# Patient Record
Sex: Female | Born: 1955 | Race: White | Hispanic: No | Smoking: Former smoker
Health system: Southern US, Community
[De-identification: ages and names within clinical notes are randomized; demographics above are authoritative.]

## PROBLEM LIST (undated history)

## (undated) DIAGNOSIS — N200 Calculus of kidney: Secondary | ICD-10-CM

## (undated) DIAGNOSIS — F32A Depression, unspecified: Secondary | ICD-10-CM

## (undated) DIAGNOSIS — I739 Peripheral vascular disease, unspecified: Secondary | ICD-10-CM

## (undated) DIAGNOSIS — E785 Hyperlipidemia, unspecified: Secondary | ICD-10-CM

## (undated) DIAGNOSIS — F419 Anxiety disorder, unspecified: Secondary | ICD-10-CM

## (undated) DIAGNOSIS — N189 Chronic kidney disease, unspecified: Secondary | ICD-10-CM

## (undated) DIAGNOSIS — D649 Anemia, unspecified: Secondary | ICD-10-CM

## (undated) DIAGNOSIS — F329 Major depressive disorder, single episode, unspecified: Secondary | ICD-10-CM

## (undated) DIAGNOSIS — G4730 Sleep apnea, unspecified: Secondary | ICD-10-CM

## (undated) DIAGNOSIS — K219 Gastro-esophageal reflux disease without esophagitis: Secondary | ICD-10-CM

## (undated) DIAGNOSIS — N809 Endometriosis, unspecified: Secondary | ICD-10-CM

## (undated) HISTORY — PX: ABDOMINAL HYSTERECTOMY: SHX81

## (undated) HISTORY — PX: HERNIA REPAIR: SHX51

## (undated) HISTORY — PX: TONSILLECTOMY: SUR1361

## (undated) HISTORY — PX: EXCISION LESION FOOT: SHX6596

## (undated) HISTORY — PX: COLON SURGERY: SHX602

---

## 2005-07-22 ENCOUNTER — Ambulatory Visit: Payer: Self-pay | Admitting: Family Medicine

## 2005-09-30 ENCOUNTER — Ambulatory Visit: Payer: Self-pay | Admitting: Specialist

## 2010-02-06 ENCOUNTER — Ambulatory Visit: Payer: Self-pay | Admitting: Internal Medicine

## 2010-02-23 ENCOUNTER — Ambulatory Visit: Payer: Self-pay | Admitting: Family Medicine

## 2013-10-14 ENCOUNTER — Ambulatory Visit: Payer: Self-pay | Admitting: Internal Medicine

## 2013-12-12 DIAGNOSIS — F17210 Nicotine dependence, cigarettes, uncomplicated: Secondary | ICD-10-CM | POA: Insufficient documentation

## 2014-04-01 ENCOUNTER — Ambulatory Visit: Payer: Self-pay

## 2014-08-12 DIAGNOSIS — D3610 Benign neoplasm of peripheral nerves and autonomic nervous system, unspecified: Secondary | ICD-10-CM | POA: Insufficient documentation

## 2014-12-18 DIAGNOSIS — B001 Herpesviral vesicular dermatitis: Secondary | ICD-10-CM | POA: Insufficient documentation

## 2015-12-19 DIAGNOSIS — G4733 Obstructive sleep apnea (adult) (pediatric): Secondary | ICD-10-CM | POA: Insufficient documentation

## 2016-01-13 ENCOUNTER — Ambulatory Visit: Payer: Self-pay | Admitting: Internal Medicine

## 2016-02-04 ENCOUNTER — Encounter: Payer: Self-pay | Admitting: *Deleted

## 2016-02-05 ENCOUNTER — Encounter
Admission: RE | Disposition: A | Discharge status: Home / Self Care | Payer: Self-pay | Source: Ambulatory Visit | Attending: Gastroenterology

## 2016-02-05 ENCOUNTER — Ambulatory Visit: Payer: BLUE CROSS/BLUE SHIELD | Admitting: Anesthesiology

## 2016-02-05 ENCOUNTER — Encounter: Payer: Self-pay | Admitting: *Deleted

## 2016-02-05 ENCOUNTER — Ambulatory Visit
Admission: RE | Admit: 2016-02-05 | Discharge: 2016-02-05 | Disposition: A | Discharge status: Home / Self Care | Payer: BLUE CROSS/BLUE SHIELD | Source: Ambulatory Visit | Attending: Gastroenterology | Admitting: Gastroenterology

## 2016-02-05 DIAGNOSIS — K219 Gastro-esophageal reflux disease without esophagitis: Secondary | ICD-10-CM | POA: Diagnosis not present

## 2016-02-05 DIAGNOSIS — Z7982 Long term (current) use of aspirin: Secondary | ICD-10-CM | POA: Diagnosis not present

## 2016-02-05 DIAGNOSIS — N189 Chronic kidney disease, unspecified: Secondary | ICD-10-CM | POA: Insufficient documentation

## 2016-02-05 DIAGNOSIS — F17200 Nicotine dependence, unspecified, uncomplicated: Secondary | ICD-10-CM | POA: Diagnosis not present

## 2016-02-05 DIAGNOSIS — J449 Chronic obstructive pulmonary disease, unspecified: Secondary | ICD-10-CM | POA: Diagnosis not present

## 2016-02-05 DIAGNOSIS — E785 Hyperlipidemia, unspecified: Secondary | ICD-10-CM | POA: Diagnosis not present

## 2016-02-05 DIAGNOSIS — Z79899 Other long term (current) drug therapy: Secondary | ICD-10-CM | POA: Insufficient documentation

## 2016-02-05 DIAGNOSIS — I739 Peripheral vascular disease, unspecified: Secondary | ICD-10-CM | POA: Diagnosis not present

## 2016-02-05 DIAGNOSIS — D120 Benign neoplasm of cecum: Secondary | ICD-10-CM | POA: Diagnosis not present

## 2016-02-05 DIAGNOSIS — Z8371 Family history of colonic polyps: Secondary | ICD-10-CM | POA: Diagnosis not present

## 2016-02-05 DIAGNOSIS — F329 Major depressive disorder, single episode, unspecified: Secondary | ICD-10-CM | POA: Diagnosis not present

## 2016-02-05 DIAGNOSIS — Z1211 Encounter for screening for malignant neoplasm of colon: Secondary | ICD-10-CM | POA: Insufficient documentation

## 2016-02-05 HISTORY — DX: Peripheral vascular disease, unspecified: I73.9

## 2016-02-05 HISTORY — PX: COLONOSCOPY WITH PROPOFOL: SHX5780

## 2016-02-05 HISTORY — DX: Anemia, unspecified: D64.9

## 2016-02-05 HISTORY — DX: Anxiety disorder, unspecified: F41.9

## 2016-02-05 HISTORY — DX: Calculus of kidney: N20.0

## 2016-02-05 HISTORY — DX: Major depressive disorder, single episode, unspecified: F32.9

## 2016-02-05 HISTORY — DX: Depression, unspecified: F32.A

## 2016-02-05 HISTORY — DX: Chronic kidney disease, unspecified: N18.9

## 2016-02-05 HISTORY — DX: Hyperlipidemia, unspecified: E78.5

## 2016-02-05 HISTORY — DX: Endometriosis, unspecified: N80.9

## 2016-02-05 HISTORY — DX: Gastro-esophageal reflux disease without esophagitis: K21.9

## 2016-02-05 SURGERY — COLONOSCOPY WITH PROPOFOL
Anesthesia: General

## 2016-02-05 MED ORDER — SODIUM CHLORIDE 0.9 % IV SOLN
INTRAVENOUS | Status: DC
  Administered 2016-02-05: 10:00:00 via INTRAVENOUS

## 2016-02-05 MED ORDER — LIDOCAINE HCL (CARDIAC) 20 MG/ML IV SOLN
INTRAVENOUS | Status: DC | PRN
  Administered 2016-02-05: 60 mg via INTRAVENOUS

## 2016-02-05 MED ORDER — PROPOFOL 10 MG/ML IV BOLUS
INTRAVENOUS | Status: DC | PRN
  Administered 2016-02-05: 40 mg via INTRAVENOUS

## 2016-02-05 MED ORDER — SODIUM CHLORIDE 0.9 % IV SOLN: INTRAVENOUS | Status: DC

## 2016-02-05 MED ORDER — PROPOFOL 500 MG/50ML IV EMUL
INTRAVENOUS | Status: DC | PRN
  Administered 2016-02-05: 150 ug/kg/min via INTRAVENOUS

## 2016-02-05 NOTE — H&P (Signed)
Primary Care Physician:  Gayland Curry, MD Primary Gastroenterologist:  Dr. Candace Cruise  Pre-Procedure History & Physical: HPI:  Marie Melendez is a 60 y.o. female is here for an colonoscopy.  Past Medical History  Diagnosis Date  . Anemia   . Anxiety   . Depression   . GERD (gastroesophageal reflux disease)   . Peripheral vascular disease ([place])   . Chronic kidney disease   . Hyperlipidemia   . Endometriosis   . Nephrolithiasis     Past Surgical History  Procedure Laterality Date  . Tonsillectomy    . Colon surgery    . Abdominal hysterectomy    . Excision lesion foot    . Hernia repair      Prior to Admission medications   Medication Sig Start Date End Date Taking? Authorizing Provider  ascorbic acid (VITAMIN C) 1000 MG tablet Take 1,000 mg by mouth daily.   Yes Historical Provider, MD  aspirin (ASPIRIN EC) 81 MG EC tablet Take 81 mg by mouth daily. Swallow whole.   Yes Historical Provider, MD  Cholecalciferol 1000 units capsule Take 1,000 Units by mouth daily.   Yes Historical Provider, MD  cyanocobalamin 1000 MCG tablet Take 1,000 mcg by mouth daily.   Yes Historical Provider, MD  lovastatin (MEVACOR) 10 MG tablet Take 80 mg by mouth at bedtime.   Yes Historical Provider, MD  lovastatin (MEVACOR) 20 MG tablet Take 80 mg by mouth at bedtime.   Yes Historical Provider, MD  Lysine 500 MG TABS Take by mouth.   Yes Historical Provider, MD  nicotine (NICODERM CQ - DOSED IN MG/24 HOURS) 21 mg/24hr patch Place 21 mg onto the skin daily.   Yes Historical Provider, MD  omega-3 acid ethyl esters (LOVAZA) 1 g capsule Take 1 g by mouth daily.   Yes Historical Provider, MD  omeprazole (PRILOSEC) 20 MG capsule Take 20 mg by mouth daily as needed.   Yes Historical Provider, MD  PARoxetine (PAXIL) 20 MG tablet Take 20 mg by mouth daily.   Yes Historical Provider, MD    Allergies as of 01/27/2016  . (Not on File)    History reviewed. No pertinent family history.  Social History    Social History  . Marital Status: Divorced    Spouse Name: N/A  . Number of Children: N/A  . Years of Education: N/A   Occupational History  . Not on file.   Social History Main Topics  . Smoking status: Current Every Day Smoker -- 1.00 packs/day  . Smokeless tobacco: Never Used  . Alcohol Use: Yes  . Drug Use: Not on file  . Sexual Activity: Not on file   Other Topics Concern  . Not on file   Social History Narrative    Review of Systems: See HPI, otherwise negative ROS  Physical Exam: BP 135/72 mmHg  Pulse 74  Temp(Src) 97.4 F (36.3 C) (Tympanic)  Resp 18  Ht 5\' 5"  (1.651 m)  Wt 164 lb (74.39 kg)  BMI 27.29 kg/m2  SpO2 96% General:   Alert,  pleasant and cooperative in NAD Head:  Normocephalic and atraumatic. Neck:  Supple; no masses or thyromegaly. Lungs:  Clear throughout to auscultation.    Heart:  Regular rate and rhythm. Abdomen:  Soft, nontender and nondistended. Normal bowel sounds, without guarding, and without rebound.   Neurologic:  Alert and  oriented x4;  grossly normal neurologically.  Impression/Plan: Marie Melendez is here for an colonoscopy to be performed for screening,  family hx of colon polyps  Risks, benefits, limitations, and alternatives regarding  colonoscopy have been reviewed with the patient.  Questions have been answered.  All parties agreeable.   Pershing Skidmore, Lupita Dawn, MD  02/05/2016, 10:14 AM

## 2016-02-05 NOTE — Transfer of Care (Signed)
Immediate Anesthesia Transfer of Care Note  Patient: Marie Melendez  Procedure(s) Performed: Procedure(s): COLONOSCOPY WITH PROPOFOL (N/A)  Patient Location: Endoscopy Unit  Anesthesia Type:General  Level of Consciousness: awake, alert , oriented and patient cooperative  Airway & Oxygen Therapy: Patient Spontanous Breathing and Patient connected to nasal cannula oxygen  Post-op Assessment: Report given to RN, Post -op Vital signs reviewed and stable and Patient moving all extremities X 4  Post vital signs: Reviewed and stable  Last Vitals:  Filed Vitals:   02/05/16 0920  BP: 135/72  Pulse: 74  Temp: 36.3 C  Resp: 18    Last Pain: There were no vitals filed for this visit.       Complications: No apparent anesthesia complications

## 2016-02-05 NOTE — Anesthesia Preprocedure Evaluation (Signed)
Anesthesia Evaluation  Patient identified by MRN, date of birth, ID band Patient awake    Reviewed: Allergy & Precautions, H&P , NPO status , Patient's Chart, lab work & pertinent test results  History of Anesthesia Complications Negative for: history of anesthetic complications  Airway Mallampati: III  TM Distance: >3 FB Neck ROM: limited    Dental  (+) Poor Dentition   Pulmonary neg shortness of breath, COPD, Current Smoker,    Pulmonary exam normal breath sounds clear to auscultation       Cardiovascular Exercise Tolerance: Good (-) angina+ Peripheral Vascular Disease  (-) Past MI and (-) DOE negative cardio ROS Normal cardiovascular exam Rhythm:regular Rate:Normal     Neuro/Psych PSYCHIATRIC DISORDERS Anxiety Depression negative neurological ROS     GI/Hepatic Neg liver ROS, GERD  Controlled,  Endo/Other  negative endocrine ROS  Renal/GU Renal disease  negative genitourinary   Musculoskeletal   Abdominal   Peds  Hematology negative hematology ROS (+)   Anesthesia Other Findings Past Medical History:   Anemia                                                       Anxiety                                                      Depression                                                   GERD (gastroesophageal reflux disease)                       Peripheral vascular disease (H[facility])                            Chronic kidney disease                                       Hyperlipidemia                                               Endometriosis                                                Nephrolithiasis                                             Past Surgical History:   TONSILLECTOMY  COLON SURGERY                                                 ABDOMINAL HYSTERECTOMY                                        EXCISION LESION FOOT                                           HERNIA REPAIR                                                BMI    Body Mass Index   27.29 kg/m 2      Reproductive/Obstetrics negative OB ROS                             Anesthesia Physical Anesthesia Plan  ASA: III  Anesthesia Plan: General   Post-op Pain Management:    Induction:   Airway Management Planned:   Additional Equipment:   Intra-op Plan:   Post-operative Plan:   Informed Consent: I have reviewed the patients History and Physical, chart, labs and discussed the procedure including the risks, benefits and alternatives for the proposed anesthesia with the patient or authorized representative who has indicated his/her understanding and acceptance.   Dental Advisory Given  Plan Discussed with: Anesthesiologist, CRNA and Surgeon  Anesthesia Plan Comments:         Anesthesia Quick Evaluation

## 2016-02-05 NOTE — Op Note (Signed)
[facility] Gastroenterology Patient Name: Marie Melendez Procedure Date: 02/05/2016 10:29 AM MRN: [id number] Account #: [id number] Date of Birth: 04/03/1956 Admit Type: Outpatient Age: 60 Room: [facility] ENDO ROOM 4 Gender: Female Note Status: Finalized Procedure:            Colonoscopy Indications:          Colon cancer screening in patient at increased risk:                        Family history of colon polyps in multiple 1st-degree                        relatives Providers:            Lupita Dawn. Candace Cruise, MD Referring MD:         Gayland Curry, MD (Referring MD) Medicines:            Monitored Anesthesia Care Complications:        No immediate complications. Procedure:            Pre-Anesthesia Assessment:                       - Prior to the procedure, a History and Physical was                        performed, and patient medications, allergies and                        sensitivities were reviewed. The patient's tolerance of                        previous anesthesia was reviewed.                       - The risks and benefits of the procedure and the                        sedation options and risks were discussed with the                        patient. All questions were answered and informed                        consent was obtained.                       - After reviewing the risks and benefits, the patient                        was deemed in satisfactory condition to undergo the                        procedure.                       After obtaining informed consent, the colonoscope was                        passed under direct vision. Throughout the procedure,  the patient's blood pressure, pulse, and oxygen                        saturations were monitored continuously. The                        Colonoscope was introduced through the anus and                        advanced to the the cecum, identified by appendiceal              orifice and ileocecal valve. The colonoscopy was                        performed without difficulty. The patient tolerated the                        procedure well. The quality of the bowel preparation                        was fair. Findings:      A medium polyp was found in the cecum. The polyp was sessile. The polyp       was removed with a hot snare. Resection and retrieval were complete.      The exam was otherwise without abnormality. Impression:           - Preparation of the colon was fair.                       - One medium polyp in the cecum, removed with a hot                        snare. Resected and retrieved.                       - The examination was otherwise normal. Recommendation:       - Discharge patient to home.                       - Await pathology results.                       - Repeat colonoscopy in 3 - 5 years for surveillance                        based on pathology results.                       - The findings and recommendations were discussed with                        the patient. Procedure Code(s):    --- Professional ---                       [phone], Colonoscopy, flexible; with removal of tumor(s),                        polyp(s), or other lesion(s) by snare technique Diagnosis Code(s):    --- Professional ---  Z83.71, Family history of colonic polyps                       D12.0, Benign neoplasm of cecum CPT copyright 2016 [employer]. All rights reserved. The codes documented in this report are preliminary and upon coder review may  be revised to meet current compliance requirements. Hulen Luster, MD 02/05/2016 10:44:11 AM This report has been signed electronically. Number of Addenda: 0 Note Initiated On: 02/05/2016 10:29 AM Scope Withdrawal Time: 0 hours 5 minutes 33 seconds  Total Procedure Duration: 0 hours 7 minutes 55 seconds       [facility]

## 2016-02-05 NOTE — Anesthesia Postprocedure Evaluation (Signed)
Anesthesia Post Note  Patient: [name]  Procedure(s) Performed: Procedure(s) (LRB): COLONOSCOPY WITH PROPOFOL (N/A)  Patient location during evaluation: Endoscopy Anesthesia Type: General Level of consciousness: awake and alert Pain management: pain level controlled Vital Signs Assessment: post-procedure vital signs reviewed and stable Respiratory status: spontaneous breathing, nonlabored ventilation, respiratory function stable and patient connected to nasal cannula oxygen Cardiovascular status: blood pressure returned to baseline and stable Postop Assessment: no signs of nausea or vomiting Anesthetic complications: no    Last Vitals:  Filed Vitals:   02/05/16 1050 02/05/16 1110  BP: 111/71 128/72  Pulse:    Temp:    Resp:      Last Pain: There were no vitals filed for this visit.               Precious Haws Piscitello

## 2016-02-06 ENCOUNTER — Encounter: Payer: Self-pay | Admitting: Gastroenterology

## 2016-02-06 LAB — SURGICAL PATHOLOGY

## 2016-03-26 ENCOUNTER — Other Ambulatory Visit: Payer: Self-pay | Admitting: Family Medicine

## 2016-03-26 DIAGNOSIS — Z1231 Encounter for screening mammogram for malignant neoplasm of breast: Secondary | ICD-10-CM

## 2016-04-12 ENCOUNTER — Other Ambulatory Visit: Payer: Self-pay | Admitting: Family Medicine

## 2016-04-12 ENCOUNTER — Ambulatory Visit
Admission: RE | Admit: 2016-04-12 | Discharge: 2016-04-12 | Disposition: A | Discharge status: Home / Self Care | Payer: BLUE CROSS/BLUE SHIELD | Source: Ambulatory Visit | Attending: Family Medicine | Admitting: Family Medicine

## 2016-04-12 DIAGNOSIS — Z1231 Encounter for screening mammogram for malignant neoplasm of breast: Secondary | ICD-10-CM | POA: Insufficient documentation

## 2016-04-12 DIAGNOSIS — R928 Other abnormal and inconclusive findings on diagnostic imaging of breast: Secondary | ICD-10-CM | POA: Insufficient documentation

## 2016-04-15 ENCOUNTER — Other Ambulatory Visit: Payer: Self-pay | Admitting: Family Medicine

## 2016-04-15 DIAGNOSIS — R928 Other abnormal and inconclusive findings on diagnostic imaging of breast: Secondary | ICD-10-CM

## 2016-05-03 ENCOUNTER — Ambulatory Visit
Admission: RE | Admit: 2016-05-03 | Discharge: 2016-05-03 | Disposition: A | Discharge status: Home / Self Care | Payer: BLUE CROSS/BLUE SHIELD | Source: Ambulatory Visit | Attending: Family Medicine | Admitting: Family Medicine

## 2016-05-03 DIAGNOSIS — R928 Other abnormal and inconclusive findings on diagnostic imaging of breast: Secondary | ICD-10-CM | POA: Diagnosis not present

## 2016-05-03 DIAGNOSIS — N6489 Other specified disorders of breast: Secondary | ICD-10-CM | POA: Diagnosis present

## 2016-12-22 DIAGNOSIS — D72829 Elevated white blood cell count, unspecified: Secondary | ICD-10-CM | POA: Insufficient documentation

## 2017-02-04 ENCOUNTER — Ambulatory Visit (INDEPENDENT_AMBULATORY_CARE_PROVIDER_SITE_OTHER): Payer: BC Managed Care – PPO | Admitting: Vascular Surgery

## 2017-02-04 ENCOUNTER — Telehealth (INDEPENDENT_AMBULATORY_CARE_PROVIDER_SITE_OTHER): Payer: Self-pay | Admitting: Vascular Surgery

## 2017-02-04 ENCOUNTER — Encounter (INDEPENDENT_AMBULATORY_CARE_PROVIDER_SITE_OTHER): Payer: Self-pay | Admitting: Vascular Surgery

## 2017-02-04 VITALS — BP 149/83 | HR 90 | Resp 16 | Wt 173.0 lb

## 2017-02-04 DIAGNOSIS — I872 Venous insufficiency (chronic) (peripheral): Secondary | ICD-10-CM

## 2017-02-04 DIAGNOSIS — M7989 Other specified soft tissue disorders: Secondary | ICD-10-CM | POA: Insufficient documentation

## 2017-02-04 DIAGNOSIS — E785 Hyperlipidemia, unspecified: Secondary | ICD-10-CM | POA: Diagnosis not present

## 2017-02-04 NOTE — Telephone Encounter (Signed)
Per Dr. Lucky Cowboy we will bring the patient in to have unna boots placed. An appt was offered.

## 2017-02-04 NOTE — Assessment & Plan Note (Signed)
lipid control important in reducing the progression of atherosclerotic disease. Continue statin therapy  

## 2017-02-04 NOTE — Telephone Encounter (Signed)
Patient called in stating she needs unna wraps done today. She has seen KS in the past. I advised her it would need to come from a provider. But that I would message the nurses for advice.

## 2017-02-04 NOTE — Progress Notes (Signed)
MRN : [id number]  Marie Melendez is a 61 y.o. (11/04/1955) female who presents with chief complaint of  Chief Complaint  Patient presents with  . Follow-up  .  History of Present Illness: Patient returns today in follow up of Left leg swelling and discoloration. She has a previous history of a venous ulcer just above the left ankle. She has been having foot problems recently and wrapping her foot she has noticed increasing puffiness and prominence of the left ankle. She has some noticeable venous stasis areas there at a baseline, but these are more prominent recently. Her right leg really does not have much in the way of issues. Her foot hurts on both sides, but her right leg does not have the venous issue she does on the left. She just started wearing compression stockings today and she has noticed that has helped.  Current Outpatient Prescriptions  Medication Sig Dispense Refill  . ascorbic acid (VITAMIN C) 1000 MG tablet Take 1,000 mg by mouth daily.    Marland Kitchen aspirin (ASPIRIN EC) 81 MG EC tablet Take 81 mg by mouth daily. Swallow whole.    . Cholecalciferol 1000 units capsule Take 1,000 Units by mouth daily.    . cyanocobalamin 1000 MCG tablet Take 1,000 mcg by mouth daily.    Marland Kitchen lovastatin (MEVACOR) 20 MG tablet Take 80 mg by mouth at bedtime.    Marland Kitchen Lysine 500 MG TABS Take by mouth.    . nicotine (NICODERM CQ - DOSED IN MG/24 HOURS) 21 mg/24hr patch Place 21 mg onto the skin daily.    Marland Kitchen omega-3 acid ethyl esters (LOVAZA) 1 g capsule Take 1 g by mouth daily.    Marland Kitchen omeprazole (PRILOSEC) 20 MG capsule Take 20 mg by mouth daily as needed.    Marland Kitchen PARoxetine (PAXIL) 20 MG tablet Take 20 mg by mouth daily.    Marland Kitchen lovastatin (MEVACOR) 10 MG tablet Take 80 mg by mouth at bedtime.     No current facility-administered medications for this visit.     Past Medical History:  Diagnosis Date  . Anemia   . Anxiety   . Chronic kidney disease   . Depression   . Endometriosis   . GERD (gastroesophageal  reflux disease)   . Hyperlipidemia   . Nephrolithiasis   . Peripheral vascular disease [facility])     Past Surgical History:  Procedure Laterality Date  . ABDOMINAL HYSTERECTOMY    . COLON SURGERY    . COLONOSCOPY WITH PROPOFOL N/A 02/05/2016   Procedure: COLONOSCOPY WITH PROPOFOL;  Surgeon: Hulen Luster, MD;  Location: [facility] ENDOSCOPY;  Service: Gastroenterology;  Laterality: N/A;  . EXCISION LESION FOOT    . HERNIA REPAIR    . TONSILLECTOMY      Social History Social History  Substance Use Topics  . Smoking status: Current Every Day Smoker    Packs/day: 1.00  . Smokeless tobacco: Never Used  . Alcohol use Yes    Family History Family History  Problem Relation Age of Onset  . Breast cancer Mother 76  . Thyroid cancer Mother   . Lung cancer Father   . Breast cancer Paternal Aunt   . Bone cancer Maternal Grandmother   . Breast cancer Maternal Grandmother   . Lymphoma Maternal Grandmother   . Colon cancer Paternal Grandfather     Allergies  Allergen Reactions  . Oxycodone   . Sulfa Antibiotics      REVIEW OF SYSTEMS (Negative unless checked)  Constitutional: []   Weight loss  [] Fever  [] Chills Cardiac: [] Chest pain   [] Chest pressure   [] Palpitations   [] Shortness of breath when laying flat   [] Shortness of breath at rest   [] Shortness of breath with exertion. Vascular:  [] Pain in legs with walking   [] Pain in legs at rest   [] Pain in legs when laying flat   [] Claudication   [] Pain in feet when walking  [] Pain in feet at rest  [] Pain in feet when laying flat   [] History of DVT   [] Phlebitis   [x] Swelling in legs   [x] Varicose veins   [] Non-healing ulcers Pulmonary:   [] Uses home oxygen   [] Productive cough   [] Hemoptysis   [] Wheeze  [] COPD   [] Asthma Neurologic:  [] Dizziness  [] Blackouts   [] Seizures   [] History of stroke   [] History of TIA  [] Aphasia   [] Temporary blindness   [] Dysphagia   [] Weakness or numbness in arms   [] Weakness or numbness in legs Musculoskeletal:   [] Arthritis   [x] Joint swelling   [x] Joint pain   [] Low back pain Hematologic:  [] Easy bruising  [] Easy bleeding   [] Hypercoagulable state   [] Anemic   Gastrointestinal:  [] Blood in stool   [] Vomiting blood  [] Gastroesophageal reflux/heartburn   [] Abdominal pain Genitourinary:  [] Chronic kidney disease   [] Difficult urination  [] Frequent urination  [] Burning with urination   [] Hematuria Skin:  [] Rashes   [] Ulcers   [] Wounds Psychological:  [] History of anxiety   []  History of major depression.  Physical Examination  BP (!) 149/83   Pulse 90   Resp 16   Wt 173 lb (78.5 kg)   BMI 28.79 kg/m  Gen:  WD/WN, NAD Head: [place]/AT, No temporalis wasting. Ear/Nose/Throat: Hearing grossly intact, nares w/o erythema or drainage, trachea midline Eyes: Conjunctiva clear. Sclera non-icteric Neck: Supple.  No JVD.  Pulmonary:  Good air movement, no use of accessory muscles.  Cardiac: RRR, normal S1, S2 Vascular:  Vessel Right Left  Radial Palpable Palpable  Ulnar Palpable Palpable  Brachial Palpable Palpable  Carotid Palpable, without bruit Palpable, without bruit  Aorta Not palpable N/A  Femoral Palpable Palpable  Popliteal Palpable Palpable  PT Palpable Palpable  DP Palpable Palpable   Gastrointestinal: soft, non-tender/non-distended. No guarding/reflex.  Musculoskeletal: M/S 5/5 throughout.  No deformity or atrophy. 1+ LLE edema with prominent varicosities and stasis changes down around the foot and ankle.Marland Kitchen Neurologic: Sensation grossly intact in extremities.  Symmetrical.  Speech is fluent.  Psychiatric: Judgment intact, Mood & affect appropriate for pt's clinical situation. Dermatologic: A small discolored area about 3-4 cm above the left medial ankle is present from her previous healed ulceration.      Labs No results found for this or any previous visit (from the past 2160 hour(s)).  Radiology No results found.    Assessment/Plan  Hyperlipidemia lipid control important in  reducing the progression of atherosclerotic disease. Continue statin therapy   Swelling of limb Recommended the regular use of compression stockings, elevation, and increasing activity.  Venous (peripheral) insufficiency The patient has some prominent varicosities with venous stasis changes on the left leg. At this point, I would recommend compression stockings and elevation. I do not believe the swelling is bad enough and there is no skin breakdown to require Unna boot at this time. The patient will follow up on an as-needed basis.    Leotis Pain, MD  02/04/2017 4:13 PM    This note was created with Dragon medical transcription system.  Any errors from dictation are  purely unintentional

## 2017-02-04 NOTE — Assessment & Plan Note (Signed)
The patient has some prominent varicosities with venous stasis changes on the left leg. At this point, I would recommend compression stockings and elevation. I do not believe the swelling is bad enough and there is no skin breakdown to require Unna boot at this time. The patient will follow up on an as-needed basis.

## 2017-02-04 NOTE — Telephone Encounter (Signed)
LMVM TO INFORM PATIENT SHE COULD COME IN TODAY TO BE SEEN BY JD HE SAYS SHE CAN COME AT 1:00 P.M

## 2017-02-04 NOTE — Assessment & Plan Note (Signed)
Recommended the regular use of compression stockings, elevation, and increasing activity.

## 2017-03-10 DIAGNOSIS — R918 Other nonspecific abnormal finding of lung field: Secondary | ICD-10-CM | POA: Insufficient documentation

## 2018-01-09 ENCOUNTER — Ambulatory Visit
Admission: EM | Admit: 2018-01-09 | Discharge: 2018-01-09 | Disposition: A | Discharge status: Home / Self Care | Payer: BC Managed Care – PPO | Attending: Family Medicine | Admitting: Family Medicine

## 2018-01-09 ENCOUNTER — Encounter: Payer: Self-pay | Admitting: *Deleted

## 2018-01-09 DIAGNOSIS — J0100 Acute maxillary sinusitis, unspecified: Secondary | ICD-10-CM | POA: Diagnosis not present

## 2018-01-09 DIAGNOSIS — J441 Chronic obstructive pulmonary disease with (acute) exacerbation: Secondary | ICD-10-CM

## 2018-01-09 MED ORDER — ALBUTEROL SULFATE HFA 108 (90 BASE) MCG/ACT IN AERS: 1.0000 | INHALATION_SPRAY | Freq: Four times a day (QID) | RESPIRATORY_TRACT | 0 refills | Status: DC | PRN

## 2018-01-09 MED ORDER — DOXYCYCLINE HYCLATE 100 MG PO TABS: 100.0000 mg | ORAL_TABLET | Freq: Two times a day (BID) | ORAL | 0 refills | Status: DC

## 2018-01-09 MED ORDER — PREDNISONE 20 MG PO TABS: 20.0000 mg | ORAL_TABLET | Freq: Every day | ORAL | 0 refills | Status: DC

## 2018-01-09 NOTE — ED Triage Notes (Signed)
C/o cough, pressure and facial pain, body aches and ear fullness. Pt thinks she has sinus infection. No temperature reported.

## 2018-01-09 NOTE — ED Provider Notes (Signed)
[facility]    CSN: [id number] Arrival date & time: 01/09/18  1237     History   Chief Complaint Chief Complaint  Patient presents with  . Facial Pain    HPI Marie Melendez is a 62 y.o. female.   The history is provided by the patient.  URI  Presenting symptoms: congestion, cough, facial pain, fever and rhinorrhea   Severity:  Moderate Onset quality:  Sudden Duration:  1 week Timing:  Constant Progression:  Worsening Chronicity:  New Relieved by:  Nothing Ineffective treatments:  OTC medications Associated symptoms: headaches, sinus pain and wheezing   Risk factors: chronic kidney disease and chronic respiratory disease (chronic smoker)   Risk factors: not elderly, no chronic cardiac disease, no diabetes mellitus, no immunosuppression, no recent illness, no recent travel and no sick contacts     Past Medical History:  Diagnosis Date  . Anemia   . Anxiety   . Chronic kidney disease   . Depression   . Endometriosis   . GERD (gastroesophageal reflux disease)   . Hyperlipidemia   . Nephrolithiasis   . Peripheral vascular disease [facility])     Patient Active Problem List   Diagnosis Date Noted  . Hyperlipidemia 02/04/2017  . Swelling of limb 02/04/2017  . Venous (peripheral) insufficiency 02/04/2017    Past Surgical History:  Procedure Laterality Date  . ABDOMINAL HYSTERECTOMY    . COLON SURGERY    . COLONOSCOPY WITH PROPOFOL N/A 02/05/2016   Procedure: COLONOSCOPY WITH PROPOFOL;  Surgeon: Hulen Luster, MD;  Location: [facility] ENDOSCOPY;  Service: Gastroenterology;  Laterality: N/A;  . EXCISION LESION FOOT    . HERNIA REPAIR    . TONSILLECTOMY      OB History   None      Home Medications    Prior to Admission medications   Medication Sig Start Date End Date Taking? Authorizing Provider  ascorbic acid (VITAMIN C) 1000 MG tablet Take 1,000 mg by mouth daily.   Yes [provider]  aspirin (ASPIRIN EC) 81 MG EC tablet Take 81 mg by mouth  daily. Swallow whole.   Yes [provider]  Cholecalciferol 1000 units capsule Take 1,000 Units by mouth daily.   Yes [provider]  cyanocobalamin 1000 MCG tablet Take 1,000 mcg by mouth daily.   Yes [provider]  omeprazole (PRILOSEC) 20 MG capsule Take 20 mg by mouth daily as needed.   Yes [provider]  PARoxetine (PAXIL) 20 MG tablet Take 20 mg by mouth daily.   Yes [provider]  albuterol (PROVENTIL HFA;VENTOLIN HFA) 108 (90 Base) MCG/ACT inhaler Inhale 1-2 puffs into the lungs every 6 (six) hours as needed for wheezing or shortness of breath. 01/09/18   Norval Gable, MD  doxycycline (VIBRA-TABS) 100 MG tablet Take 1 tablet (100 mg total) by mouth 2 (two) times daily. 01/09/18   Norval Gable, MD  lovastatin (MEVACOR) 10 MG tablet Take 80 mg by mouth at bedtime.    [provider]  lovastatin (MEVACOR) 20 MG tablet Take 80 mg by mouth at bedtime.    [provider]  Lysine 500 MG TABS Take by mouth.    [provider]  nicotine (NICODERM CQ - DOSED IN MG/24 HOURS) 21 mg/24hr patch Place 21 mg onto the skin daily.    [provider]  omega-3 acid ethyl esters (LOVAZA) 1 g capsule Take 1 g by mouth daily.    [provider]  predniSONE (  DELTASONE) 20 MG tablet Take 1 tablet (20 mg total) by mouth daily. 01/09/18   Norval Gable, MD    Family History Family History  Problem Relation Age of Onset  . Breast cancer Mother 26  . Thyroid cancer Mother   . Lung cancer Father   . Breast cancer Paternal Aunt   . Bone cancer Maternal Grandmother   . Breast cancer Maternal Grandmother   . Lymphoma Maternal Grandmother   . Colon cancer Paternal Grandfather     Social History Social History   Tobacco Use  . Smoking status: Current Every Day Smoker    Packs/day: 1.00  . Smokeless tobacco: Never Used  Substance Use Topics  . Alcohol use: Yes  . Drug use: Yes    Types: Marijuana      Allergies   Oxycodone and Sulfa antibiotics   Review of Systems Review of Systems  Constitutional: Positive for fever.  HENT: Positive for congestion, rhinorrhea and sinus pain.   Respiratory: Positive for cough and wheezing.   Neurological: Positive for headaches.     Physical Exam Triage Vital Signs ED Triage Vitals  Enc Vitals Group     BP 01/09/18 1306 (!) 142/72     Pulse Rate 01/09/18 1306 94     Resp 01/09/18 1306 16     Temp 01/09/18 1306 98.4 F (36.9 C)     Temp Source 01/09/18 1306 Oral     SpO2 01/09/18 1306 96 %     Weight 01/09/18 1302 165 lb (74.8 kg)     Height 01/09/18 1302 5\' 6"  (1.676 m)     Head Circumference --      Peak Flow --      Pain Score 01/09/18 1301 7     Pain Loc --      Pain Edu? --      Excl. in [place]? --    No data found.  Updated Vital Signs BP (!) 142/72 (BP Location: Left Arm)   Pulse 94   Temp 98.4 F (36.9 C) (Oral)   Resp 16   Ht 5\' 6"  (1.676 m)   Wt 165 lb (74.8 kg)   SpO2 96%   BMI 26.63 kg/m   Visual Acuity Right Eye Distance:   Left Eye Distance:   Bilateral Distance:    Right Eye Near:   Left Eye Near:    Bilateral Near:     Physical Exam  Constitutional: She appears well-developed and well-nourished. No distress.  HENT:  Head: Normocephalic and atraumatic.  Right Ear: Tympanic membrane, external ear and ear canal normal.  Left Ear: Tympanic membrane, external ear and ear canal normal.  Nose: Mucosal edema and rhinorrhea present. No nose lacerations, sinus tenderness, nasal deformity, septal deviation or nasal septal hematoma. No epistaxis.  No foreign bodies. Right sinus exhibits maxillary sinus tenderness and frontal sinus tenderness. Left sinus exhibits maxillary sinus tenderness and frontal sinus tenderness.  Mouth/Throat: Uvula is midline, oropharynx is clear and moist and mucous membranes are normal. No oropharyngeal exudate.  Eyes: Pupils are equal, round, and reactive to light. Conjunctivae and  EOM are normal. Right eye exhibits no discharge. Left eye exhibits no discharge. No scleral icterus.  Neck: Normal range of motion. Neck supple. No thyromegaly present.  Cardiovascular: Normal rate, regular rhythm and normal heart sounds.  Pulmonary/Chest: Effort normal. No respiratory distress. She has wheezes (plus rhonchi both diffusely). She has no rales.  Lymphadenopathy:    She has no cervical adenopathy.  Skin: She  is not diaphoretic.  Nursing note and vitals reviewed.    UC Treatments / Results  Labs (all labs ordered are listed, but only abnormal results are displayed) Labs Reviewed - No data to display  EKG None  Radiology No results found.  Procedures Procedures (including critical care time)  Medications Ordered in UC Medications - No data to display  Initial Impression / Assessment and Plan / UC Course  I have reviewed the triage vital signs and the nursing notes.  Pertinent labs & imaging results that were available during my care of the patient were reviewed by me and considered in my medical decision making (see chart for details).    Final Clinical Impressions(s) / UC Diagnoses   Final diagnoses:  Acute maxillary sinusitis, recurrence not specified  Obstructive chronic bronchitis with exacerbation [facility])   Discharge Instructions   None    ED Prescriptions    Medication Sig Dispense Auth. Provider   doxycycline (VIBRA-TABS) 100 MG tablet Take 1 tablet (100 mg total) by mouth 2 (two) times daily. 20 tablet Norval Gable, MD   predniSONE (DELTASONE) 20 MG tablet Take 1 tablet (20 mg total) by mouth daily. 7 tablet Norval Gable, MD   albuterol (PROVENTIL HFA;VENTOLIN HFA) 108 (90 Base) MCG/ACT inhaler Inhale 1-2 puffs into the lungs every 6 (six) hours as needed for wheezing or shortness of breath. 1 Inhaler Zoee Heeney, Linward Foster, MD     1. diagnosis reviewed with patient 2. rx as per orders above; reviewed possible side effects, interactions, risks and  benefits  3. Recommend supportive treatment with otc analgesics prn, fluids 4. Follow-up prn if symptoms worsen or don't improve   Controlled Substance Prescriptions [place] Controlled Substance Registry consulted? Not Applicable   Norval Gable, MD 01/09/18 [phone]

## 2018-01-27 ENCOUNTER — Encounter: Payer: BC Managed Care – PPO | Admitting: Oncology

## 2018-02-17 ENCOUNTER — Encounter: Payer: Self-pay | Admitting: Family Medicine

## 2018-02-20 ENCOUNTER — Encounter: Payer: Self-pay | Admitting: Oncology

## 2018-02-20 ENCOUNTER — Inpatient Hospital Stay: Payer: BC Managed Care – PPO | Attending: Oncology | Admitting: Oncology

## 2018-02-20 ENCOUNTER — Telehealth (INDEPENDENT_AMBULATORY_CARE_PROVIDER_SITE_OTHER): Payer: Self-pay | Admitting: Vascular Surgery

## 2018-02-20 VITALS — BP 152/94 | HR 79 | Temp 97.9°F | Resp 18 | Ht 66.0 in | Wt 170.5 lb

## 2018-02-20 DIAGNOSIS — I83892 Varicose veins of left lower extremities with other complications: Secondary | ICD-10-CM | POA: Diagnosis not present

## 2018-02-20 DIAGNOSIS — I8392 Asymptomatic varicose veins of left lower extremity: Secondary | ICD-10-CM | POA: Insufficient documentation

## 2018-02-20 DIAGNOSIS — M7989 Other specified soft tissue disorders: Secondary | ICD-10-CM | POA: Insufficient documentation

## 2018-02-20 NOTE — Progress Notes (Signed)
Hematology/Oncology Consult note [facility] Telephone:([phone] [phone]  Patient Care Team: Gayland Curry, MD as PCP - General (Family Medicine)   Name of the patient: Marie Melendez  [id number]  08-18-1956    Reason for referral- family h/o DVT   Referring physician- Dr. Astrid Divine  Date of visit: 02/20/18   History of presenting illness-patient is a 62 year old female with a history of varicose veins.  She reports that she had a superficial venous thrombosis 20 years ago.  States that she never had any deep venous thrombosis or pulmonary embolism.  She was apparently treated with Coumadin at that time.  She does not remember how long she she has not been blood thinner for the last 15 years.  She has not had any recurrent episodes of DVT or PE since then.  She reports that her mother had pulmonary embolism due to unclear reasons and is possibly still on anticoagulation. H/o DVT in her grandfather and maternal aunt. Patient has had 1 prior pregnancy that was uneventful. No miscarriages. She does have varicosities in her b/l legs especially the left one. She has seen vein center in the past. Currently reports that balls of her feet hurt. She did have mildly worsening LLE edema a few days ago which is getting better. She is wearing b/l compression stockings  Doppler lower extremity back in 2008 at [facility] showed: "Summary of Findings Right No evidence of ilio-CFV obstruction. LeftThere is no evidence of deep venous obstruction. There is no evidence  of acute superficial venous obstruction. There is evidence of previous  obstruction in the GSV in the distal lower leg, now recanalized.There is  evidence of chronic obstruction in the proximal portion of the small saphenous  vein (Upper calf)."  ECOG PS- 1  Pain scale- 9   Review of systems- Review of Systems  Constitutional: Negative for chills, fever,  malaise/fatigue and weight loss.  HENT: Negative for congestion, ear discharge and nosebleeds.   Eyes: Negative for blurred vision.  Respiratory: Negative for cough, hemoptysis, sputum production, shortness of breath and wheezing.   Cardiovascular: Negative for chest pain, palpitations, orthopnea and claudication.  Gastrointestinal: Negative for abdominal pain, blood in stool, constipation, diarrhea, heartburn, melena, nausea and vomiting.  Genitourinary: Negative for dysuria, flank pain, frequency, hematuria and urgency.  Musculoskeletal: Negative for back pain, joint pain and myalgias.       Foot pain  Skin: Negative for rash.  Neurological: Negative for dizziness, tingling, focal weakness, seizures, weakness and headaches.  Endo/Heme/Allergies: Does not bruise/bleed easily.  Psychiatric/Behavioral: Negative for depression and suicidal ideas. The patient does not have insomnia.     Allergies  Allergen Reactions  . Oxycodone   . Sulfa Antibiotics     Patient Active Problem List   Diagnosis Date Noted  . Hyperlipidemia 02/04/2017  . Swelling of limb 02/04/2017  . Venous (peripheral) insufficiency 02/04/2017     Past Medical History:  Diagnosis Date  . Anemia   . Anxiety   . Chronic kidney disease   . Depression   . Endometriosis   . GERD (gastroesophageal reflux disease)   . Hyperlipidemia   . Nephrolithiasis   . Peripheral vascular disease [facility])      Past Surgical History:  Procedure Laterality Date  . ABDOMINAL HYSTERECTOMY    . COLON SURGERY    . COLONOSCOPY WITH PROPOFOL N/A 02/05/2016   Procedure: COLONOSCOPY WITH PROPOFOL;  Surgeon: Hulen Luster, MD;  Location: [facility] ENDOSCOPY;  Service: Gastroenterology;  Laterality:  N/A;  . EXCISION LESION FOOT    . HERNIA REPAIR    . TONSILLECTOMY      Social History   Socioeconomic History  . Marital status: Divorced    Spouse name: Not on file  . Number of children: Not on file  . Years of education: Not on file  .  Highest education level: Not on file  Occupational History  . Not on file  Social Needs  . Financial resource strain: Not on file  . Food insecurity:    Worry: Not on file    Inability: Not on file  . Transportation needs:    Medical: Not on file    Non-medical: Not on file  Tobacco Use  . Smoking status: Current Every Day Smoker    Packs/day: 1.00  . Smokeless tobacco: Never Used  Substance and Sexual Activity  . Alcohol use: Yes  . Drug use: Yes    Types: Marijuana  . Sexual activity: Not on file  Lifestyle  . Physical activity:    Days per week: Not on file    Minutes per session: Not on file  . Stress: Not on file  Relationships  . Social connections:    Talks on phone: Not on file    Gets together: Not on file    Attends religious service: Not on file    Active member of club or organization: Not on file    Attends meetings of clubs or organizations: Not on file    Relationship status: Not on file  . Intimate partner violence:    Fear of current or ex partner: Not on file    Emotionally abused: Not on file    Physically abused: Not on file    Forced sexual activity: Not on file  Other Topics Concern  . Not on file  Social History Narrative  . Not on file     Family History  Problem Relation Age of Onset  . Breast cancer Mother 77  . Thyroid cancer Mother   . Lung cancer Father   . Breast cancer Paternal Aunt   . Bone cancer Maternal Grandmother   . Breast cancer Maternal Grandmother   . Lymphoma Maternal Grandmother   . Colon cancer Paternal Grandfather      Current Outpatient Medications:  .  ascorbic acid (VITAMIN C) 1000 MG tablet, Take 1,000 mg by mouth daily., Disp: , Rfl:  .  aspirin (ASPIRIN EC) 81 MG EC tablet, Take 81 mg by mouth daily. Swallow whole., Disp: , Rfl:  .  lovastatin (MEVACOR) 20 MG tablet, Take 40 mg by mouth 2 (two) times daily. , Disp: , Rfl:  .  Lysine 500 MG TABS, Take by mouth., Disp: , Rfl:  .  omega-3 acid ethyl esters  (LOVAZA) 1 g capsule, Take 1 g by mouth daily., Disp: , Rfl:  .  omeprazole (PRILOSEC) 20 MG capsule, Take 20 mg by mouth daily as needed., Disp: , Rfl:  .  PARoxetine (PAXIL) 20 MG tablet, Take 20 mg by mouth daily., Disp: , Rfl:  .  albuterol (PROVENTIL HFA;VENTOLIN HFA) 108 (90 Base) MCG/ACT inhaler, Inhale 1-2 puffs into the lungs every 6 (six) hours as needed for wheezing or shortness of breath. (Patient not taking: Reported on 02/20/2018), Disp: 1 Inhaler, Rfl: 0 .  Cholecalciferol 1000 units capsule, Take 1,000 Units by mouth daily., Disp: , Rfl:  .  cyanocobalamin 1000 MCG tablet, Take 1,000 mcg by mouth daily., Disp: , Rfl:  .  doxycycline (VIBRA-TABS) 100 MG tablet, Take 1 tablet (100 mg total) by mouth 2 (two) times daily. (Patient not taking: Reported on 02/20/2018), Disp: 20 tablet, Rfl: 0 .  lovastatin (MEVACOR) 10 MG tablet, Take 80 mg by mouth at bedtime., Disp: , Rfl:  .  nicotine (NICODERM CQ - DOSED IN MG/24 HOURS) 21 mg/24hr patch, Place 21 mg onto the skin daily., Disp: , Rfl:  .  predniSONE (DELTASONE) 20 MG tablet, Take 1 tablet (20 mg total) by mouth daily. (Patient not taking: Reported on 02/20/2018), Disp: 7 tablet, Rfl: 0   Physical exam:  Vitals:   02/20/18 1000  BP: (!) 152/94  Pulse: 79  Resp: 18  Temp: 97.9 F (36.6 C)  TempSrc: Tympanic  SpO2: 98%  Weight: 170 lb 8.4 oz (77.3 kg)  Height: 5\' 6"  (1.676 m)   Physical Exam  Constitutional: She is oriented to person, place, and time. She appears well-developed and well-nourished.  HENT:  Head: Normocephalic and atraumatic.  Eyes: Pupils are equal, round, and reactive to light. EOM are normal.  Neck: Normal range of motion.  Cardiovascular: Normal rate, regular rhythm and normal heart sounds.  Pulmonary/Chest: Effort normal and breath sounds normal.  Abdominal: Soft. Bowel sounds are normal.  Musculoskeletal:  Varicosities noted over b/l LE. Left >right. Left  leg appears mildly more swollen than right    Neurological: She is alert and oriented to person, place, and time.  Skin: Skin is warm and dry.     Assessment and plan- Patient is a 62 y.o. female referred due to prior h/o thrombosis and extensive family h/o thrombosis  On looking back at doppler report from 2008- no DVT was seen at that time. There was a mention of previous obstruction in great saphenous vein which is a superficial vein. As such I do not see any conclusive evidence of prior DVT. Even if she did have DVT 15-20 years ago, she has not had one since then despite being off anticoagulation. Therefore there is no need for hypercoagulable work up at this time despite family history. No indication for prophylactic anticoagulation at this time.  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 I will obtain doppler LLE to r/o DVT but my suspicion is low at this point  We will call her with the results of her doppler. No hematology follow up needed at this time  Thank you for this kind referral and the opportunity to participate in the care of this patient   Visit Diagnosis 1. Swelling of left lower extremity   2. 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 Randa Evens, MD, MPH [facility] [place] [phone] 02/20/2018  12:32 PM

## 2018-02-20 NOTE — Telephone Encounter (Signed)
Patients wants to know when can she get her legs wrapped. Having swelling and said feels likes she bustin out a little, and her pain in feet. Can be reached at number in the system.

## 2018-02-21 ENCOUNTER — Telehealth (INDEPENDENT_AMBULATORY_CARE_PROVIDER_SITE_OTHER): Payer: Self-pay

## 2018-02-21 ENCOUNTER — Ambulatory Visit
Admission: RE | Admit: 2018-02-21 | Discharge: 2018-02-21 | Disposition: A | Discharge status: Home / Self Care | Payer: BC Managed Care – PPO | Source: Ambulatory Visit | Attending: Oncology | Admitting: Oncology

## 2018-02-21 ENCOUNTER — Telehealth: Payer: Self-pay | Admitting: *Deleted

## 2018-02-21 DIAGNOSIS — I83892 Varicose veins of left lower extremities with other complications: Secondary | ICD-10-CM

## 2018-02-21 DIAGNOSIS — M7989 Other specified soft tissue disorders: Secondary | ICD-10-CM

## 2018-02-21 NOTE — Telephone Encounter (Signed)
Patient called again and left another message about having her legs wrapped. Per Maudie Mercury she can have her legs wrapped for one month, and then she would need to see Maudie Mercury for a one month follow up.  Should the patient call again, she needs to set an appointment and a follow up with Maudie Mercury.

## 2018-02-21 NOTE — Telephone Encounter (Signed)
Called the patient back and left a message for the patient to call our office back to make an appointment to have her Unna boots placed and for her to wear them for one month, and then she should follow-up with Maudie Mercury at that point.

## 2018-02-21 NOTE — Telephone Encounter (Signed)
Called patient and left her message that her ultrasound of her left lower extremity was negative for any kind of clot.  She can call back if she has questions.

## 2018-02-21 NOTE — Telephone Encounter (Signed)
Yes. She can come in to get her legs wrapped with a one month follow up.

## 2018-02-21 NOTE — Telephone Encounter (Signed)
I left a message for the patient to call the office back to make a appointment to get a unna wrap today

## 2018-02-22 ENCOUNTER — Ambulatory Visit (INDEPENDENT_AMBULATORY_CARE_PROVIDER_SITE_OTHER): Payer: BC Managed Care – PPO | Admitting: Vascular Surgery

## 2018-02-22 ENCOUNTER — Encounter (INDEPENDENT_AMBULATORY_CARE_PROVIDER_SITE_OTHER): Payer: Self-pay

## 2018-02-22 ENCOUNTER — Telehealth (INDEPENDENT_AMBULATORY_CARE_PROVIDER_SITE_OTHER): Payer: Self-pay | Admitting: Vascular Surgery

## 2018-02-22 VITALS — BP 142/78 | HR 75 | Resp 16 | Ht 66.0 in | Wt 171.0 lb

## 2018-02-22 DIAGNOSIS — M7989 Other specified soft tissue disorders: Secondary | ICD-10-CM

## 2018-02-22 NOTE — Telephone Encounter (Signed)
Please call this patient back and get her scheduled to have her Unna boots placed. There are several notes that correspond to this appointment that needs to be made. She needs to be put on the schedule for Unna boots for the next month, and then to see Maudie Mercury on her fourth wrap.

## 2018-02-22 NOTE — Progress Notes (Signed)
History of Present Illness  There is no documented history at this time  Assessments & Plan   There are no diagnoses linked to this encounter.    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    Procedure:  3 layer unna wrap was placed Left lower extremity.   Plan:   Follow up in one week.  

## 2018-02-23 ENCOUNTER — Other Ambulatory Visit (INDEPENDENT_AMBULATORY_CARE_PROVIDER_SITE_OTHER): Payer: Self-pay | Admitting: Vascular Surgery

## 2018-02-23 ENCOUNTER — Telehealth (INDEPENDENT_AMBULATORY_CARE_PROVIDER_SITE_OTHER): Payer: Self-pay

## 2018-02-23 DIAGNOSIS — M79605 Pain in left leg: Principal | ICD-10-CM

## 2018-02-23 DIAGNOSIS — M79604 Pain in right leg: Secondary | ICD-10-CM

## 2018-02-23 DIAGNOSIS — M79606 Pain in leg, unspecified: Secondary | ICD-10-CM | POA: Insufficient documentation

## 2018-02-23 NOTE — Telephone Encounter (Signed)
Patient called stating that her leg is in lots of pain and that she has been out of work for a couple of days. She came in yesterday for a Unna boot, and she says that her legs are in so much pain that she can't stand to work. She works at a [employer].  I spoke to her and told her that usually there are no restriction with wearing the unna boots and working. She wants a note for missing work if possible?

## 2018-02-23 NOTE — Telephone Encounter (Signed)
We can give her a note excusing her from work on February 22, 2018 as she was seen in our office and had unna boots placed.  Unfortunately, I cannot excuse her from any other days she may have missed work. I will order an ABI at the hospital for the patient just to assess her arterial blood flow to make sure there is not any emergent arterial issue.  Please let the patient know she should be receiving a phone call from the radiology department to set up this test. The patient has already been ruled out for DVT by her oncologist.  I recommend that the patient elevate her legs heart level or higher as much as possible to help control the edema.  If the patient continues to have severe pain she should be assessed by her primary care physician or possibly even urgent care for any other issue that may not be vascular.

## 2018-02-23 NOTE — Telephone Encounter (Signed)
Called the patient back to offer her the letter for work for yesterday, she said that it would not help her out. And I also told her to be on the lookout for a call from the radiology dept and that they would be calling to setup her ultrasound.

## 2018-02-24 ENCOUNTER — Encounter (INDEPENDENT_AMBULATORY_CARE_PROVIDER_SITE_OTHER): Payer: BC Managed Care – PPO

## 2018-02-24 ENCOUNTER — Ambulatory Visit (INDEPENDENT_AMBULATORY_CARE_PROVIDER_SITE_OTHER): Payer: BC Managed Care – PPO | Admitting: Vascular Surgery

## 2018-02-27 ENCOUNTER — Ambulatory Visit (INDEPENDENT_AMBULATORY_CARE_PROVIDER_SITE_OTHER): Payer: BC Managed Care – PPO | Admitting: Vascular Surgery

## 2018-03-01 ENCOUNTER — Encounter (INDEPENDENT_AMBULATORY_CARE_PROVIDER_SITE_OTHER): Payer: Self-pay

## 2018-03-01 ENCOUNTER — Ambulatory Visit (INDEPENDENT_AMBULATORY_CARE_PROVIDER_SITE_OTHER): Payer: BC Managed Care – PPO | Admitting: Vascular Surgery

## 2018-03-01 VITALS — BP 142/81 | HR 81 | Resp 16 | Ht 66.0 in | Wt 171.4 lb

## 2018-03-01 DIAGNOSIS — I872 Venous insufficiency (chronic) (peripheral): Secondary | ICD-10-CM

## 2018-03-01 DIAGNOSIS — M7989 Other specified soft tissue disorders: Secondary | ICD-10-CM

## 2018-03-01 NOTE — Progress Notes (Signed)
History of Present Illness  There is no documented history at this time  Assessments & Plan   There are no diagnoses linked to this encounter.    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    Procedure:  3 layer unna wrap was placed Left lower extremity.   Plan:   Follow up in one week.  

## 2018-03-06 ENCOUNTER — Ambulatory Visit
Admission: RE | Admit: 2018-03-06 | Discharge: 2018-03-06 | Disposition: A | Discharge status: Home / Self Care | Payer: BC Managed Care – PPO | Source: Ambulatory Visit | Attending: Vascular Surgery | Admitting: Vascular Surgery

## 2018-03-06 DIAGNOSIS — M79604 Pain in right leg: Secondary | ICD-10-CM | POA: Insufficient documentation

## 2018-03-06 DIAGNOSIS — M79605 Pain in left leg: Secondary | ICD-10-CM | POA: Diagnosis present

## 2018-03-08 ENCOUNTER — Ambulatory Visit (INDEPENDENT_AMBULATORY_CARE_PROVIDER_SITE_OTHER): Payer: BC Managed Care – PPO | Admitting: Vascular Surgery

## 2018-03-08 ENCOUNTER — Encounter (INDEPENDENT_AMBULATORY_CARE_PROVIDER_SITE_OTHER): Payer: Self-pay

## 2018-03-08 VITALS — BP 128/78 | HR 92 | Resp 16 | Ht 66.0 in | Wt 174.0 lb

## 2018-03-08 DIAGNOSIS — I872 Venous insufficiency (chronic) (peripheral): Secondary | ICD-10-CM

## 2018-03-08 NOTE — Progress Notes (Signed)
History of Present Illness  There is no documented history at this time  Assessments & Plan   There are no diagnoses linked to this encounter.    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    Procedure:  3 layer unna wrap was placed Left lower extremity.   Plan:   Follow up in one week.  

## 2018-03-14 ENCOUNTER — Ambulatory Visit (INDEPENDENT_AMBULATORY_CARE_PROVIDER_SITE_OTHER): Payer: BC Managed Care – PPO | Admitting: Vascular Surgery

## 2018-03-14 ENCOUNTER — Encounter (INDEPENDENT_AMBULATORY_CARE_PROVIDER_SITE_OTHER): Payer: BC Managed Care – PPO

## 2018-03-14 ENCOUNTER — Encounter (INDEPENDENT_AMBULATORY_CARE_PROVIDER_SITE_OTHER): Payer: Self-pay | Admitting: Vascular Surgery

## 2018-03-14 VITALS — BP 135/78 | HR 77 | Resp 15 | Ht 64.0 in | Wt 171.0 lb

## 2018-03-14 DIAGNOSIS — I872 Venous insufficiency (chronic) (peripheral): Secondary | ICD-10-CM

## 2018-03-14 DIAGNOSIS — M7989 Other specified soft tissue disorders: Secondary | ICD-10-CM | POA: Diagnosis not present

## 2018-03-14 NOTE — Progress Notes (Signed)
Subjective:    Patient ID: Marie Melendez, female    DOB: 02/08/1956, 62 y.o.   MRN: [id number] Chief Complaint  Patient presents with  . Follow-up    Unna boot check   Patient presents for a monthly left lower extremity Unna boot therapy/wound check.  The patient also presents to review vascular studies.  The patient has undergone 3 layer zinc oxide Unna wraps to the left lower extremity after sustaining trauma and a wound to the medial calf.  The patient also experienced a lymphedema exacerbation which had made her wound hard to heal.  The patient presents today very have the in the improvement in her swelling and her wound now healed.  The patient underwent a bilateral ABI conducted at [facility] radiology department which was notable for right lower extremity resting ABI 1.21 and left lower extremity resting ABI 1.23 no significant arterial occlusive disease was noted in the lower extremities.  The patient notes that she does have compression socks at home and she will continue to elevate her legs.  The patient denies any new ulcer development.  The patient denies any fever, nausea vomiting.  Review of Systems  Constitutional: Negative.   HENT: Negative.   Eyes: Negative.   Respiratory: Negative.   Cardiovascular: Negative.   Gastrointestinal: Negative.   Endocrine: Negative.   Genitourinary: Negative.   Musculoskeletal: Negative.   Skin: Negative.   Allergic/Immunologic: Negative.   Neurological: Negative.   Hematological: Negative.   Psychiatric/Behavioral: Negative.       Objective:   Physical Exam  Constitutional: She is oriented to person, place, and time. She appears well-developed and well-nourished. No distress.  HENT:  Head: Normocephalic and atraumatic.  Right Ear: External ear normal.  Left Ear: External ear normal.  Eyes: Pupils are equal, round, and reactive to light. Conjunctivae and EOM are normal.  Neck: Normal range of motion.    Cardiovascular: Normal rate, regular rhythm and normal heart sounds.  Pulmonary/Chest: Effort normal and breath sounds normal.  Musculoskeletal: Normal range of motion. She exhibits no edema.  Neurological: She is alert and oriented to person, place, and time.  Skin: Skin is warm and dry. She is not diaphoretic.  Left lower extremity wound is healed  Psychiatric: She has a normal mood and affect. Her behavior is normal. Judgment and thought content normal.  Vitals reviewed.  BP 135/78 (BP Location: Right Arm, Patient Position: Sitting)   Pulse 77   Resp 15   Ht 5\' 4"  (1.626 m)   Wt 171 lb (77.6 kg)   BMI 29.35 kg/m   Past Medical History:  Diagnosis Date  . Anemia   . Anxiety   . Chronic kidney disease   . Depression   . Endometriosis   . GERD (gastroesophageal reflux disease)   . Hyperlipidemia   . Nephrolithiasis   . Peripheral vascular disease ([place])    Social History   Socioeconomic History  . Marital status: Divorced    Spouse name: Not on file  . Number of children: Not on file  . Years of education: Not on file  . Highest education level: Not on file  Occupational History  . Not on file  Social Needs  . Financial resource strain: Not on file  . Food insecurity:    Worry: Not on file    Inability: Not on file  . Transportation needs:    Medical: Not on file    Non-medical: Not on file  Tobacco Use  .  Smoking status: Current Every Day Smoker    Packs/day: 1.00  . Smokeless tobacco: Never Used  Substance and Sexual Activity  . Alcohol use: Yes  . Drug use: Yes    Types: Marijuana  . Sexual activity: Not on file  Lifestyle  . Physical activity:    Days per week: Not on file    Minutes per session: Not on file  . Stress: Not on file  Relationships  . Social connections:    Talks on phone: Not on file    Gets together: Not on file    Attends religious service: Not on file    Active member of club or organization: Not on file    Attends meetings of  clubs or organizations: Not on file    Relationship status: Not on file  . Intimate partner violence:    Fear of current or ex partner: Not on file    Emotionally abused: Not on file    Physically abused: Not on file    Forced sexual activity: Not on file  Other Topics Concern  . Not on file  Social History Narrative  . Not on file   Past Surgical History:  Procedure Laterality Date  . ABDOMINAL HYSTERECTOMY    . COLON SURGERY    . COLONOSCOPY WITH PROPOFOL N/A 02/05/2016   Procedure: COLONOSCOPY WITH PROPOFOL;  Surgeon: Hulen Luster, MD;  Location: [facility] ENDOSCOPY;  Service: Gastroenterology;  Laterality: N/A;  . EXCISION LESION FOOT    . HERNIA REPAIR    . TONSILLECTOMY     Family History  Problem Relation Age of Onset  . Breast cancer Mother 46  . Thyroid cancer Mother   . Lung cancer Father   . Breast cancer Paternal Aunt   . Bone cancer Maternal Grandmother   . Breast cancer Maternal Grandmother   . Lymphoma Maternal Grandmother   . Colon cancer Paternal Grandfather    Allergies  Allergen Reactions  . Oxycodone   . Sulfa Antibiotics       Assessment & Plan:  Patient presents for a monthly left lower extremity Unna boot therapy/wound check.  The patient also presents to review vascular studies.  The patient has undergone 3 layer zinc oxide Unna wraps to the left lower extremity after sustaining trauma and a wound to the medial calf.  The patient also experienced a lymphedema exacerbation which had made her wound hard to heal.  The patient presents today very have the in the improvement in her swelling and her wound now healed.  The patient underwent a bilateral ABI conducted at [facility] radiology department which was notable for right lower extremity resting ABI 1.21 and left lower extremity resting ABI 1.23 no significant arterial occlusive disease was noted in the lower extremities.  The patient notes that she does have compression socks at home and  she will continue to elevate her legs.  The patient denies any new ulcer development.  The patient denies any fever, nausea vomiting.  1. Venous (peripheral) insufficiency - Stable The patient has been ruled out for a DVT to the left lower extremity however has never undergone an official venous duplex to rule out venous reflux At this time, the patient is not interested in moving forward with this test. The patient was encouraged to wear graduated compression stockings (20-30 mmHg) on a daily basis. The patient was instructed to begin wearing the stockings first thing in the morning and removing them in the evening. The patient  was instructed specifically not to sleep in the stockings.  In addition, behavioral modification including elevation during the day will be initiated. Anti-inflammatories for pain. Patient would like to follow-up PRN at this time. The patient was instructed to call the office in the interim if any worsening edema or ulcerations to the legs, feet or toes occurs. The patient expresses their understanding.  - VAS [place] LOWER EXTREMITY VENOUS REFLUX; Future  2. Swelling of limb - Stable The patient's swelling is markedly improved The patient's ulceration has healed It is okay to stop on a wraps to the left lower extremity and transition into medical grade 1 compression socks at this time.  Current Outpatient Medications on File Prior to Visit  Medication Sig Dispense Refill  . albuterol (PROVENTIL HFA;VENTOLIN HFA) 108 (90 Base) MCG/ACT inhaler Inhale 1-2 puffs into the lungs every 6 (six) hours as needed for wheezing or shortness of breath. 1 Inhaler 0  . ascorbic acid (VITAMIN C) 1000 MG tablet Take 1,000 mg by mouth daily.    Marland Kitchen aspirin (ASPIRIN EC) 81 MG EC tablet Take 81 mg by mouth daily. Swallow whole.    . Cholecalciferol 1000 units capsule Take 1,000 Units by mouth daily.    . cyanocobalamin 1000 MCG tablet Take 1,000 mcg by mouth daily.    Marland Kitchen doxycycline  (VIBRA-TABS) 100 MG tablet Take 1 tablet (100 mg total) by mouth 2 (two) times daily. (Patient not taking: Reported on 03/01/2018) 20 tablet 0  . lovastatin (MEVACOR) 40 MG tablet TAKE 2 TABLETS BY MOUTH ONCE DAILY WITH DINNER  11  . Lysine 500 MG TABS Take by mouth.    . nicotine (NICODERM CQ - DOSED IN MG/24 HOURS) 21 mg/24hr patch Place 21 mg onto the skin daily.    Marland Kitchen omega-3 acid ethyl esters (LOVAZA) 1 g capsule Take 1 g by mouth daily.    Marland Kitchen omeprazole (PRILOSEC) 20 MG capsule Take 20 mg by mouth daily as needed.    Marland Kitchen PARoxetine (PAXIL) 20 MG tablet Take 20 mg by mouth daily.    . predniSONE (DELTASONE) 20 MG tablet Take 1 tablet (20 mg total) by mouth daily. 7 tablet 0   No current facility-administered medications on file prior to visit.     There are no Patient Instructions on file for this visit. No follow-ups on file.   Tunya Held A Shekina Cordell, PA-C

## 2018-04-06 DIAGNOSIS — R2231 Localized swelling, mass and lump, right upper limb: Secondary | ICD-10-CM | POA: Insufficient documentation

## 2018-05-26 ENCOUNTER — Other Ambulatory Visit: Payer: Self-pay | Admitting: Family Medicine

## 2018-05-26 DIAGNOSIS — Z1231 Encounter for screening mammogram for malignant neoplasm of breast: Secondary | ICD-10-CM

## 2018-06-09 ENCOUNTER — Ambulatory Visit
Admission: RE | Admit: 2018-06-09 | Discharge: 2018-06-09 | Disposition: A | Discharge status: Home / Self Care | Payer: BC Managed Care – PPO | Source: Ambulatory Visit | Attending: Family Medicine | Admitting: Family Medicine

## 2018-06-09 DIAGNOSIS — Z1231 Encounter for screening mammogram for malignant neoplasm of breast: Secondary | ICD-10-CM | POA: Insufficient documentation

## 2018-10-13 ENCOUNTER — Ambulatory Visit
Admission: RE | Admit: 2018-10-13 | Discharge: 2018-10-13 | Disposition: A | Discharge status: Home / Self Care | Payer: BC Managed Care – PPO | Source: Ambulatory Visit | Attending: Family Medicine | Admitting: Family Medicine

## 2018-10-13 ENCOUNTER — Other Ambulatory Visit: Payer: Self-pay | Admitting: Family Medicine

## 2018-10-13 ENCOUNTER — Ambulatory Visit
Admission: RE | Admit: 2018-10-13 | Discharge: 2018-10-13 | Disposition: A | Discharge status: Home / Self Care | Payer: BC Managed Care – PPO | Attending: Family Medicine | Admitting: Family Medicine

## 2018-10-13 DIAGNOSIS — M79671 Pain in right foot: Secondary | ICD-10-CM

## 2019-01-22 DIAGNOSIS — F1011 Alcohol abuse, in remission: Secondary | ICD-10-CM | POA: Insufficient documentation

## 2019-02-21 DIAGNOSIS — M79671 Pain in right foot: Secondary | ICD-10-CM | POA: Insufficient documentation

## 2019-02-21 DIAGNOSIS — Z1389 Encounter for screening for other disorder: Secondary | ICD-10-CM | POA: Insufficient documentation

## 2019-06-27 ENCOUNTER — Other Ambulatory Visit: Payer: Self-pay | Admitting: *Deleted

## 2019-06-27 DIAGNOSIS — Z20822 Contact with and (suspected) exposure to covid-19: Secondary | ICD-10-CM

## 2019-06-28 LAB — NOVEL CORONAVIRUS, NAA: SARS-CoV-2, NAA: NOT DETECTED

## 2019-06-29 ENCOUNTER — Telehealth: Payer: Self-pay | Admitting: Family Medicine

## 2019-06-29 NOTE — Telephone Encounter (Signed)
Negative COVID results given. Patient results "NOT Detected." Caller expressed understanding. ° °

## 2019-08-08 ENCOUNTER — Ambulatory Visit
Admission: EM | Admit: 2019-08-08 | Discharge: 2019-08-08 | Disposition: A | Discharge status: Home / Self Care | Payer: BC Managed Care – PPO | Attending: Family Medicine | Admitting: Family Medicine

## 2019-08-08 ENCOUNTER — Encounter: Payer: Self-pay | Admitting: Emergency Medicine

## 2019-08-08 ENCOUNTER — Other Ambulatory Visit: Payer: Self-pay

## 2019-08-08 DIAGNOSIS — J9801 Acute bronchospasm: Secondary | ICD-10-CM

## 2019-08-08 DIAGNOSIS — J069 Acute upper respiratory infection, unspecified: Secondary | ICD-10-CM | POA: Diagnosis not present

## 2019-08-08 DIAGNOSIS — F17210 Nicotine dependence, cigarettes, uncomplicated: Secondary | ICD-10-CM | POA: Diagnosis not present

## 2019-08-08 MED ORDER — BENZONATATE 200 MG PO CAPS: 200.0000 mg | ORAL_CAPSULE | Freq: Three times a day (TID) | ORAL | 0 refills | Status: DC | PRN

## 2019-08-08 MED ORDER — PREDNISONE 10 MG PO TABS: ORAL_TABLET | ORAL | 0 refills | Status: DC

## 2019-08-08 NOTE — ED Triage Notes (Signed)
Patient in today c/o 4 day history of cough, nasal congestion, scratchy throat and body aches. Patient denies fever. Patient has tried OTC Copywriter, advertising cold plus, vitamin c and Zicam without relief.

## 2019-08-08 NOTE — ED Provider Notes (Signed)
[facility]    CSN: [id number] Arrival date & time: 08/08/19  0820      History   Chief Complaint Chief Complaint  Patient presents with  . Cough  . Nasal Congestion  . Generalized Body Aches    HPI Marie Melendez is a 63 y.o. female.   63 yo female with a c/o 4-5 days of cough, nasal congestion, scratchy, throat, body aches. States cough is worse at night. Denies any fevers, shortness of breath, loss of taste or smell, known sick contacts.    Cough   Past Medical History:  Diagnosis Date  . Anemia   . Anxiety   . Chronic kidney disease   . Depression   . Endometriosis   . GERD (gastroesophageal reflux disease)   . Hyperlipidemia   . Nephrolithiasis   . Peripheral vascular disease [facility])     Patient Active Problem List   Diagnosis Date Noted  . Lower extremity pain 02/23/2018  . Hyperlipidemia 02/04/2017  . Swelling of limb 02/04/2017  . Venous (peripheral) insufficiency 02/04/2017    Past Surgical History:  Procedure Laterality Date  . ABDOMINAL HYSTERECTOMY    . COLON SURGERY    . COLONOSCOPY WITH PROPOFOL N/A 02/05/2016   Procedure: COLONOSCOPY WITH PROPOFOL;  Surgeon: Hulen Luster, MD;  Location: [facility] ENDOSCOPY;  Service: Gastroenterology;  Laterality: N/A;  . EXCISION LESION FOOT    . HERNIA REPAIR    . TONSILLECTOMY      OB History   No obstetric history on file.      Home Medications    Prior to Admission medications   Medication Sig Start Date End Date Taking? Authorizing Provider  albuterol (PROVENTIL HFA;VENTOLIN HFA) 108 (90 Base) MCG/ACT inhaler Inhale 1-2 puffs into the lungs every 6 (six) hours as needed for wheezing or shortness of breath. 01/09/18  Yes Fabion Gatson, Linward Foster, MD  ascorbic acid (VITAMIN C) 1000 MG tablet Take 1,000 mg by mouth daily.   Yes [provider]  atorvastatin (LIPITOR) 40 MG tablet Take 40 mg by mouth daily. 07/26/19  Yes [provider]  cyanocobalamin 1000 MCG tablet Take 1,000 mcg by  mouth daily.   Yes [provider]  Lysine 500 MG TABS Take by mouth.   Yes [provider]  omega-3 acid ethyl esters (LOVAZA) 1 g capsule Take 1 g by mouth daily.   Yes [provider]  omeprazole (PRILOSEC) 20 MG capsule Take 20 mg by mouth daily as needed.   Yes [provider]  PARoxetine (PAXIL) 20 MG tablet Take 20 mg by mouth daily.   Yes [provider]  UNABLE TO [place] extract   Yes [provider]  aspirin (ASPIRIN EC) 81 MG EC tablet Take 81 mg by mouth daily. Swallow whole.    [provider]  benzonatate (TESSALON) 200 MG capsule Take 1 capsule (200 mg total) by mouth 3 (three) times daily as needed. 08/08/19   Norval Gable, MD  Cholecalciferol 1000 units capsule Take 1,000 Units by mouth daily.    [provider]  doxycycline (VIBRA-TABS) 100 MG tablet Take 1 tablet (100 mg total) by mouth 2 (two) times daily. Patient not taking: Reported on 03/01/2018 01/09/18   Norval Gable, MD  lovastatin (MEVACOR) 40 MG tablet TAKE 2 TABLETS BY MOUTH ONCE DAILY WITH DINNER 02/17/18   [provider]  nicotine (NICODERM CQ - DOSED IN MG/24 HOURS) 21 mg/24hr patch Place 21 mg onto the skin daily.  [provider]  predniSONE (DELTASONE) 10 MG tablet Start 60 mg po day one, then 50 mg po day two, taper by 10 mg daily until complete. 08/08/19   Norval Gable, MD    Family History Family History  Problem Relation Age of Onset  . Breast cancer Mother 73  . Thyroid cancer Mother   . Lung cancer Father   . Breast cancer Paternal Aunt   . Bone cancer Maternal Grandmother   . Breast cancer Maternal Grandmother   . Lymphoma Maternal Grandmother   . Colon cancer Paternal Grandfather     Social History Social History   Tobacco Use  . Smoking status: Current Every Day Smoker    Packs/day: 1.00    Types: Cigarettes  . Smokeless tobacco: Never Used  Substance Use Topics  . Alcohol use: Not  Currently    Comment: quit Jan 2020  . Drug use: Yes    Frequency: 28.0 times per week    Types: Marijuana    Comment: 4 times per day     Allergies   Oxycodone and Sulfa antibiotics   Review of Systems Review of Systems  Respiratory: Positive for cough.      Physical Exam Triage Vital Signs ED Triage Vitals  Enc Vitals Group     BP 08/08/19 0832 124/75     Pulse Rate 08/08/19 0832 83     Resp 08/08/19 0832 18     Temp 08/08/19 0832 98.6 F (37 C)     Temp Source 08/08/19 0832 Oral     SpO2 08/08/19 0832 96 %     Weight 08/08/19 0835 180 lb (81.6 kg)     Height 08/08/19 0835 5\' 6"  (1.676 m)     Head Circumference --      Peak Flow --      Pain Score 08/08/19 0832 7     Pain Loc --      Pain Edu? --      Excl. in [place]? --    No data found.  Updated Vital Signs BP 124/75 (BP Location: Left Arm)   Pulse 83   Temp 98.6 F (37 C) (Oral)   Resp 18   Ht 5\' 6"  (1.676 m)   Wt 81.6 kg   SpO2 96%   BMI 29.05 kg/m   Visual Acuity Right Eye Distance:   Left Eye Distance:   Bilateral Distance:    Right Eye Near:   Left Eye Near:    Bilateral Near:     Physical Exam Vitals signs and nursing note reviewed.  Constitutional:      General: She is not in acute distress.    Appearance: She is not toxic-appearing or diaphoretic.  Cardiovascular:     Rate and Rhythm: Normal rate.  Pulmonary:     Effort: Pulmonary effort is normal. No respiratory distress.     Breath sounds: No stridor. Wheezing (few, mild) present. No rhonchi or rales.  Neurological:     Mental Status: She is alert.      UC Treatments / Results  Labs (all labs ordered are listed, but only abnormal results are displayed) Labs Reviewed  NOVEL CORONAVIRUS, NAA (HOSP ORDER, SEND-OUT TO REF LAB; TAT 18-24 HRS)    EKG   Radiology No results found.  Procedures Procedures (including critical care time)  Medications Ordered in UC Medications - No data to display  Initial Impression /  Assessment and Plan / UC Course  I have reviewed the triage vital  signs and the nursing notes.  Pertinent labs & imaging results that were available during my care of the patient were reviewed by me and considered in my medical decision making (see chart for details).      Final Clinical Impressions(s) / UC Diagnoses   Final diagnoses:  Viral URI with cough  Bronchospasm    ED Prescriptions    Medication Sig Dispense Auth. Provider   predniSONE (DELTASONE) 10 MG tablet Start 60 mg po day one, then 50 mg po day two, taper by 10 mg daily until complete. 21 tablet Griff Badley, Linward Foster, MD   benzonatate (TESSALON) 200 MG capsule Take 1 capsule (200 mg total) by mouth 3 (three) times daily as needed. 30 capsule Norval Gable, MD      1. diagnosis reviewed with patient 2. rx as per orders above; reviewed possible side effects, interactions, risks and benefits  3. Recommend supportive treatment with rest, fluids, over the counter analgesics prn 4. Follow-up prn if symptoms worsen or don't improve   I have reviewed the PDMP during this encounter.   Norval Gable, MD 08/08/19 [phone]

## 2019-08-09 LAB — NOVEL CORONAVIRUS, NAA (HOSP ORDER, SEND-OUT TO REF LAB; TAT 18-24 HRS): SARS-CoV-2, NAA: NOT DETECTED

## 2019-12-17 ENCOUNTER — Telehealth (INDEPENDENT_AMBULATORY_CARE_PROVIDER_SITE_OTHER): Payer: Self-pay | Admitting: Vascular Surgery

## 2019-12-18 ENCOUNTER — Telehealth (INDEPENDENT_AMBULATORY_CARE_PROVIDER_SITE_OTHER): Payer: Self-pay

## 2019-12-18 NOTE — Telephone Encounter (Signed)
I called the pt and made her aware of the  NP comments and instruction.

## 2019-12-18 NOTE — Telephone Encounter (Signed)
Pt called and said that she is scheduled to have a U/S on May 2 an would like more information on the procedure before she has the U/S complete. Please advise

## 2019-12-18 NOTE — Telephone Encounter (Signed)
The patient came into our office due to leg pain yesterday.  The ultrasound is to determine if there is a possible cause for the leg pain that is vascular related.  Without the ultrasound, we don't know if it is vascular related or not.  She doesn't need to do anything special to have it done such as stop medication or not eat.  It is not invasive, they are just doing an ultrasound.

## 2020-01-08 ENCOUNTER — Other Ambulatory Visit (INDEPENDENT_AMBULATORY_CARE_PROVIDER_SITE_OTHER): Payer: Self-pay | Admitting: Vascular Surgery

## 2020-01-08 DIAGNOSIS — M79604 Pain in right leg: Secondary | ICD-10-CM

## 2020-01-15 ENCOUNTER — Ambulatory Visit (INDEPENDENT_AMBULATORY_CARE_PROVIDER_SITE_OTHER): Payer: BC Managed Care – PPO | Admitting: Vascular Surgery

## 2020-01-15 ENCOUNTER — Ambulatory Visit (INDEPENDENT_AMBULATORY_CARE_PROVIDER_SITE_OTHER): Payer: BC Managed Care – PPO

## 2020-01-15 ENCOUNTER — Other Ambulatory Visit: Payer: Self-pay

## 2020-01-15 ENCOUNTER — Encounter (INDEPENDENT_AMBULATORY_CARE_PROVIDER_SITE_OTHER): Payer: Self-pay | Admitting: Vascular Surgery

## 2020-01-15 VITALS — BP 114/66 | HR 69 | Ht 68.0 in | Wt 170.0 lb

## 2020-01-15 DIAGNOSIS — M7989 Other specified soft tissue disorders: Secondary | ICD-10-CM | POA: Diagnosis not present

## 2020-01-15 DIAGNOSIS — M79604 Pain in right leg: Secondary | ICD-10-CM

## 2020-01-15 DIAGNOSIS — F17210 Nicotine dependence, cigarettes, uncomplicated: Secondary | ICD-10-CM

## 2020-01-15 DIAGNOSIS — E785 Hyperlipidemia, unspecified: Secondary | ICD-10-CM

## 2020-01-15 DIAGNOSIS — Z86718 Personal history of other venous thrombosis and embolism: Secondary | ICD-10-CM | POA: Insufficient documentation

## 2020-01-15 DIAGNOSIS — F419 Anxiety disorder, unspecified: Secondary | ICD-10-CM | POA: Insufficient documentation

## 2020-01-15 DIAGNOSIS — M79605 Pain in left leg: Secondary | ICD-10-CM

## 2020-01-15 DIAGNOSIS — K219 Gastro-esophageal reflux disease without esophagitis: Secondary | ICD-10-CM | POA: Insufficient documentation

## 2020-01-15 DIAGNOSIS — I83812 Varicose veins of left lower extremities with pain: Secondary | ICD-10-CM | POA: Insufficient documentation

## 2020-01-15 NOTE — Assessment & Plan Note (Signed)
lipid control important in reducing the progression of atherosclerotic disease. Continue statin therapy  

## 2020-01-15 NOTE — Patient Instructions (Signed)
Nonsurgical Procedures for Varicose Veins Various nonsurgical procedures can be used to treat varicose veins. Varicose veins are swollen, twisted veins that are visible under the skin. They occur most often in the legs. These veins may appear blue and bulging. Varicose veins are caused by damage to the valves in veins. All veins have a valve that makes blood flow in only one direction. If a valve gets weak or damaged, blood can pool and cause varicose veins. You may need a procedure to treat your varicose veins if they are causing symptoms or complications, or if lifestyle changes have not helped. These procedures can reduce pain, aching, and the risk of bleeding and blood clots. They can also improve the way the affected area looks (cosmetic appearance). The three common nonsurgical procedures are:  Sclerotherapy. A chemical is injected to close off a vein.  Laser treatment. Light energy is applied to close off the vein.  Radiofrequency vein ablation. Electrical energy is used to produce heat that closes off the vein. Your health care provider will discuss the method that is best for you based on your condition. Tell a health care provider about:  Any allergies you have.  All medicines you are taking, including vitamins, herbs, eye drops, creams, and over-the-counter medicines.  Any problems you or family members have had with anesthetic medicines.  Any blood disorders you have.  Any surgeries you have had.  Any medical conditions you have.  Whether you are pregnant or may be pregnant. What are the risks? Generally, this is a safe procedure. However, problems may occur, including:  Damage to nearby nerves, tissues, or veins.  Skin irritation, sores, or dark spots.  Numbness.  Clotting.  Infection.  Allergic reactions to medicines.  Scarring.  Leg swelling.  Need for additional treatments.  Bruising. What happens before the procedure?  Ask your health care provider  about: ? Changing or stopping your regular medicines. This is especially important if you are taking diabetes medicines or blood thinners. ? Taking over-the-counter medicines, vitamins, herbs, and supplements. ? Taking medicines such as aspirin and ibuprofen. These medicines can thin your blood. Do not take these medicines unless your health care provider tells you to take them.  You may have an exam or testing. This can include a tests to: ? Check for clots and check blood flow using sound waves (Doppler ultrasound). ? Observe how blood flows through your veins by injecting a dye that outlines your veins on X-rays (angiogram). This test is used in rare cases. What happens during the procedure? One of the following procedures will be performed: Sclerotherapy This procedure is often used for small to medium veins.  A chemical (sclerosant) that irritates the lining of the vein will be injected into the vein. This will cause the varicose vein to be closed off. Sclerosants in different amounts and strengths can be used, depending on the size and location of the vein.  All of the varicose vein sites will be injected. You may need more than one treatment because new varicose veins may develop, or more than one injection may be needed for each varicose vein.  Laser treatment There are two ways that lasers are used to treat varicose veins:  Light energy from a laser may be directed onto the vein through the skin.  A needle may be used to pass a thin laser catheter into the vein to cause it to close. You may need more than one treatment if the vein re-opens. In some cases,   laser treatment may be combined with sclerotherapy. Radiofrequency vein ablation   You will be given a medicine that numbs the area (local anesthetic).  A small incision will be made near the varicose vein.  A thin tube (catheter) will be threaded into your vein.  The tip of the catheter will deploy electrodes.  The  electrodes will deliver electrical energy to produce heat that closes off the vein. What happens after the procedure?  A bandage (dressing) may be used to cover the injection site or incisions.  You may have to wear compression stockings. These stockings help to prevent blood clots and reduce swelling in your legs.  Return to your normal activities as told by your health care provider. Summary  Varicose veins are swollen, twisted veins that are visible under the skin. They occur most often in the legs.  Various procedures can be used to treat varicose veins. You may need a procedure to treat your varicose veins if they are causing symptoms or complications, or if lifestyle changes have not helped.  Your health care provider will discuss the method that is best for you based on your condition. This information is not intended to replace advice given to you by your health care provider. Make sure you discuss any questions you have with your health care provider. Document Revised: 12/22/2018 Document Reviewed: 12/10/2016 Elsevier Patient Education  2020 [employer]  

## 2020-01-15 NOTE — Assessment & Plan Note (Signed)
Control has been suboptimal with compression and elevation.  Venous insufficiency a major contributor.

## 2020-01-15 NOTE — Assessment & Plan Note (Signed)
Venous reflux study is done today. This demonstrates left great saphenous vein reflux in the calf, thigh, and saphenofemoral junction.  No current left leg superficial thrombophlebitis or DVT was identified. The patient has symptoms of venous insufficiency refractory to conservative therapy and as such a left great saphenous vein laser ablation should be considered for alleviation of symptoms.  Procedure was discussed in detail.  Risks and benefits were discussed and she is agreeable to proceed.

## 2020-01-15 NOTE — Assessment & Plan Note (Signed)
Discussed that this is deleterious to the vascular system and smoking cessation would be recommended.  Increases her risk of thrombotic issues as well.

## 2020-01-15 NOTE — Progress Notes (Signed)
MRN : [id number]  Marie Melendez is a 64 y.o. (1955/11/24) female who presents with chief complaint of  Chief Complaint  Patient presents with  . Follow-up    follow up U/S   .  History of Present Illness: Patient returns today in follow up of pain and swelling in the left leg.  The patient was last seen about 2 years ago and had an arterial work-up which was unrevealing.  She has a previous history of superficial thrombophlebitis in the left great saphenous vein has had worsening varicosities in the left leg.  This is associated with worsening pain and swelling.  She says she is quit drinking but she continues to smoke.  She has been told she had neuropathy, and has attributed much of her symptoms to that.  Her left leg is described as her bad leg.  Compression stockings, elevation, and anti-inflammatories have not provided benefit in the past.  Her symptoms progressed to the point where she returned to have the venous evaluation we have previously discussed in 2019.  This demonstrates left great saphenous vein reflux in the calf, thigh, and saphenofemoral junction.  No current left leg superficial thrombophlebitis or DVT was identified.  Current Outpatient Medications  Medication Sig Dispense Refill  . ascorbic acid (VITAMIN C) 1000 MG tablet Take 1,000 mg by mouth daily.    Marland Kitchen atorvastatin (LIPITOR) 40 MG tablet Take 40 mg by mouth daily.    . Cholecalciferol 1000 units capsule Take 1,000 Units by mouth daily.    . cyanocobalamin 1000 MCG tablet Take 1,000 mcg by mouth daily.    Marland Kitchen Lysine 500 MG TABS Take by mouth.    . omega-3 acid ethyl esters (LOVAZA) 1 g capsule Take 1 g by mouth daily.    Marland Kitchen omeprazole (PRILOSEC) 20 MG capsule Take 20 mg by mouth daily as needed.    Marland Kitchen albuterol (PROVENTIL HFA;VENTOLIN HFA) 108 (90 Base) MCG/ACT inhaler Inhale 1-2 puffs into the lungs every 6 (six) hours as needed for wheezing or shortness of breath. (Patient not taking: Reported on 01/15/2020) 1 Inhaler  0  . aspirin (ASPIRIN EC) 81 MG EC tablet Take 81 mg by mouth daily. Swallow whole.    . benzonatate (TESSALON) 200 MG capsule Take 1 capsule (200 mg total) by mouth 3 (three) times daily as needed. (Patient not taking: Reported on 01/15/2020) 30 capsule 0  . cetirizine HCl (ZYRTEC) 1 MG/ML solution Take by mouth.    . doxycycline (VIBRA-TABS) 100 MG tablet Take 1 tablet (100 mg total) by mouth 2 (two) times daily. (Patient not taking: Reported on 03/01/2018) 20 tablet 0  . lovastatin (MEVACOR) 40 MG tablet TAKE 2 TABLETS BY MOUTH ONCE DAILY WITH DINNER  11  . nicotine (NICODERM CQ - DOSED IN MG/24 HOURS) 21 mg/24hr patch Place 21 mg onto the skin daily.    Marland Kitchen PARoxetine (PAXIL) 20 MG tablet Take 20 mg by mouth daily.    . predniSONE (DELTASONE) 10 MG tablet Start 60 mg po day one, then 50 mg po day two, taper by 10 mg daily until complete. (Patient not taking: Reported on 01/15/2020) 21 tablet 0  . UNABLE TO [place] extract    . Vitamin B Complex-C CAPS Take by mouth.     No current facility-administered medications for this visit.    Past Medical History:  Diagnosis Date  . Anemia   . Anxiety   . Chronic kidney disease   . Depression   .  Endometriosis   . GERD (gastroesophageal reflux disease)   . Hyperlipidemia   . Nephrolithiasis   . Peripheral vascular disease [facility])     Past Surgical History:  Procedure Laterality Date  . ABDOMINAL HYSTERECTOMY    . COLON SURGERY    . COLONOSCOPY WITH PROPOFOL N/A 02/05/2016   Procedure: COLONOSCOPY WITH PROPOFOL;  Surgeon: Hulen Luster, MD;  Location: [facility] ENDOSCOPY;  Service: Gastroenterology;  Laterality: N/A;  . EXCISION LESION FOOT    . HERNIA REPAIR    . TONSILLECTOMY       Social History   Tobacco Use  . Smoking status: Current Every Day Smoker    Packs/day: 1.00    Types: Cigarettes  . Smokeless tobacco: Never Used  Substance Use Topics  . Alcohol use: Not Currently    Comment: quit Jan 2020  . Drug use: Yes    Frequency:  28.0 times per week    Types: Marijuana    Comment: 4 times per day       Family History  Problem Relation Age of Onset  . Breast cancer Mother 79  . Thyroid cancer Mother   . Lung cancer Father   . Breast cancer Paternal Aunt   . Bone cancer Maternal Grandmother   . Breast cancer Maternal Grandmother   . Lymphoma Maternal Grandmother   . Colon cancer Paternal Grandfather      Allergies  Allergen Reactions  . Oxycodone   . Sulfa Antibiotics      REVIEW OF SYSTEMS (Negative unless checked)  Constitutional: [] Weight loss  [] Fever  [] Chills Cardiac: [] Chest pain   [] Chest pressure   [] Palpitations   [] Shortness of breath when laying flat   [] Shortness of breath at rest   [] Shortness of breath with exertion. Vascular:  [] Pain in legs with walking   [] Pain in legs at rest   [] Pain in legs when laying flat   [] Claudication   [] Pain in feet when walking  [] Pain in feet at rest  [] Pain in feet when laying flat   [] History of DVT   [x] Phlebitis   [] Swelling in legs   [x] Varicose veins   [] Non-healing ulcers Pulmonary:   [] Uses home oxygen   [] Productive cough   [] Hemoptysis   [] Wheeze  [] COPD   [] Asthma Neurologic:  [] Dizziness  [] Blackouts   [] Seizures   [] History of stroke   [] History of TIA  [] Aphasia   [] Temporary blindness   [] Dysphagia   [] Weakness or numbness in arms   [x] Weakness or numbness in legs Musculoskeletal:  [] Arthritis   [] Joint swelling   [] Joint pain   [] Low back pain Hematologic:  [] Easy bruising  [] Easy bleeding   [] Hypercoagulable state   [] Anemic   Gastrointestinal:  [] Blood in stool   [] Vomiting blood  [x] Gastroesophageal reflux/heartburn   [] Abdominal pain Genitourinary:  [] Chronic kidney disease   [] Difficult urination  [] Frequent urination  [] Burning with urination   [] Hematuria Skin:  [] Rashes   [] Ulcers   [] Wounds Psychological:  [] History of anxiety   []  History of major depression.  Physical Examination  BP 114/66   Pulse 69   Ht 5\' 8"  (1.727 m)    Wt 170 lb (77.1 kg)   BMI 25.85 kg/m  Gen:  WD/WN, NAD Head: /AT, No temporalis wasting. Ear/Nose/Throat: Hearing grossly intact, nares w/o erythema or drainage Eyes: Conjunctiva clear. Sclera non-icteric Neck: Supple.  Trachea midline Pulmonary:  Good air movement, no use of accessory muscles.  Cardiac: RRR, no JVD Vascular:  Vessel Right Left  Radial Palpable Palpable                          PT Palpable Palpable  DP Palpable Palpable    Musculoskeletal: M/S 5/5 throughout.  No deformity or atrophy.  Diffuse varicosities in the left lower leg measuring 1 to 2 mm.  Trace left lower extremity edema. Neurologic: Sensation grossly intact in extremities.  Symmetrical.  Speech is fluent.  Psychiatric: Judgment intact, Mood & affect appropriate for pt's clinical situation. Dermatologic: No rashes or ulcers noted.  No cellulitis or open wounds.       Labs No results found for this or any previous visit (from the past 2160 hour(s)).  Radiology No results found.  Assessment/Plan  Hyperlipidemia lipid control important in reducing the progression of atherosclerotic disease. Continue statin therapy   Cigarette smoker Discussed that this is deleterious to the vascular system and smoking cessation would be recommended.  Increases her risk of thrombotic issues as well.  Swelling of limb Control has been suboptimal with compression and elevation.  Venous insufficiency a major contributor.  Varicose veins of leg with pain, left Venous reflux study is done today. This demonstrates left great saphenous vein reflux in the calf, thigh, and saphenofemoral junction.  No current left leg superficial thrombophlebitis or DVT was identified. The patient has symptoms of venous insufficiency refractory to conservative therapy and as such a left great saphenous vein laser ablation should be considered for alleviation of symptoms.  Procedure was discussed in detail.  Risks and benefits were  discussed and she is agreeable to proceed.    Leotis Pain, MD  01/15/2020 3:37 PM    This note was created with Dragon medical transcription system.  Any errors from dictation are purely unintentional

## 2020-02-29 ENCOUNTER — Other Ambulatory Visit (INDEPENDENT_AMBULATORY_CARE_PROVIDER_SITE_OTHER): Payer: BC Managed Care – PPO | Admitting: Vascular Surgery

## 2020-03-03 ENCOUNTER — Encounter (INDEPENDENT_AMBULATORY_CARE_PROVIDER_SITE_OTHER): Payer: BC Managed Care – PPO

## 2020-10-30 ENCOUNTER — Other Ambulatory Visit: Payer: Self-pay | Admitting: Family Medicine

## 2020-10-30 DIAGNOSIS — Z1231 Encounter for screening mammogram for malignant neoplasm of breast: Secondary | ICD-10-CM

## 2020-11-24 ENCOUNTER — Ambulatory Visit
Admission: RE | Admit: 2020-11-24 | Discharge: 2020-11-24 | Disposition: A | Discharge status: Home / Self Care | Payer: BC Managed Care – PPO | Source: Ambulatory Visit | Attending: Family Medicine | Admitting: Family Medicine

## 2020-11-24 ENCOUNTER — Other Ambulatory Visit: Payer: Self-pay

## 2020-11-24 DIAGNOSIS — Z1231 Encounter for screening mammogram for malignant neoplasm of breast: Secondary | ICD-10-CM | POA: Insufficient documentation

## 2021-07-23 ENCOUNTER — Encounter: Admission: RE | Payer: Self-pay | Source: Home / Self Care

## 2021-07-23 ENCOUNTER — Ambulatory Visit
Admission: RE | Admit: 2021-07-23 | Payer: BC Managed Care – PPO | Source: Home / Self Care | Admitting: Gastroenterology

## 2021-07-23 SURGERY — COLONOSCOPY WITH PROPOFOL
Anesthesia: General

## 2021-10-15 ENCOUNTER — Encounter: Payer: Self-pay | Admitting: Gastroenterology

## 2021-10-15 NOTE — H&P (Signed)
Pre-Procedure H&P   Patient ID: Marie Melendez is a 66 y.o. female.  Gastroenterology Provider: [name], DO  Referring Provider: Dr. Malvin Johns PCP: Gayland Curry, MD  Date: 10/16/2021  HPI Ms. Marie Melendez is a 66 y.o. female who presents today for Colonoscopy for Surveillance- phx colon polyps.  Pt with normal BM- no melena or hematochezia. No other acute gi complaints Hgb 15.7, mcv 96 plt 319K 2017- May- Tubular adenoma- cecum S/p hysterectomy; unclear if partial small or large bowel resection. No fhx crc or colon polyps   Past Medical History:  Diagnosis Date   Anemia    Anxiety    Chronic kidney disease    Depression    Endometriosis    GERD (gastroesophageal reflux disease)    Hyperlipidemia    Nephrolithiasis    Peripheral vascular disease ([place])    Sleep apnea     Past Surgical History:  Procedure Laterality Date   ABDOMINAL HYSTERECTOMY     COLON SURGERY     COLONOSCOPY WITH PROPOFOL N/A 02/05/2016   Procedure: COLONOSCOPY WITH PROPOFOL;  Surgeon: Hulen Luster, MD;  [facility]: [facility] ENDOSCOPY;  Service: Gastroenterology;  Laterality: N/A;   EXCISION LESION FOOT     HERNIA REPAIR     TONSILLECTOMY      Family History No h/o GI disease or malignancy  Review of Systems  Constitutional:  Negative for activity change, appetite change, chills, diaphoresis, fatigue, fever and unexpected weight change.  HENT:  Negative for trouble swallowing and voice change.   Respiratory:  Negative for shortness of breath and wheezing.   Cardiovascular:  Negative for chest pain, palpitations and leg swelling.  Gastrointestinal:  Negative for abdominal distention, abdominal pain, anal bleeding, blood in stool, constipation, diarrhea, nausea, rectal pain and vomiting.  Musculoskeletal:  Negative for arthralgias and myalgias.  Skin:  Negative for color change and pallor.  Neurological:  Negative for dizziness, syncope and weakness.  Psychiatric/Behavioral:   Negative for confusion.   All other systems reviewed and are negative.   Medications No current facility-administered medications on file prior to encounter.   Current Outpatient Medications on File Prior to Encounter  Medication Sig Dispense Refill   ascorbic acid (VITAMIN C) 1000 MG tablet Take 1,000 mg by mouth daily.     atorvastatin (LIPITOR) 40 MG tablet Take 40 mg by mouth daily.     cetirizine HCl (ZYRTEC) 1 MG/ML solution Take by mouth.     Cholecalciferol 1000 units capsule Take 1,000 Units by mouth daily.     cyanocobalamin 1000 MCG tablet Take 1,000 mcg by mouth daily.     lovastatin (MEVACOR) 40 MG tablet TAKE 2 TABLETS BY MOUTH ONCE DAILY WITH DINNER  11   omeprazole (PRILOSEC) 20 MG capsule Take 20 mg by mouth daily as needed.     PARoxetine (PAXIL) 20 MG tablet Take 20 mg by mouth daily.     Turmeric (QC TUMERIC COMPLEX PO) Take by mouth.     albuterol (PROVENTIL HFA;VENTOLIN HFA) 108 (90 Base) MCG/ACT inhaler Inhale 1-2 puffs into the lungs every 6 (six) hours as needed for wheezing or shortness of breath. (Patient not taking: Reported on 01/15/2020) 1 Inhaler 0   aspirin 81 MG EC tablet Take 81 mg by mouth daily. Swallow whole. (Patient not taking: Reported on 10/16/2021)     benzonatate (TESSALON) 200 MG capsule Take 1 capsule (200 mg total) by mouth 3 (three) times daily as needed. (Patient not taking: Reported on 01/15/2020)  30 capsule 0   doxycycline (VIBRA-TABS) 100 MG tablet Take 1 tablet (100 mg total) by mouth 2 (two) times daily. (Patient not taking: Reported on 03/01/2018) 20 tablet 0   Lysine 500 MG TABS Take by mouth.     nicotine (NICODERM CQ - DOSED IN MG/24 HOURS) 21 mg/24hr patch Place 21 mg onto the skin daily. (Patient not taking: Reported on 10/16/2021)     omega-3 acid ethyl esters (LOVAZA) 1 g capsule Take 1 g by mouth daily.     predniSONE (DELTASONE) 10 MG tablet Start 60 mg po day one, then 50 mg po day two, taper by 10 mg daily until complete. (Patient not  taking: Reported on 01/15/2020) 21 tablet 0   UNABLE TO FIND [place] extract     Vitamin B Complex-C CAPS Take by mouth.      Pertinent medications related to GI and procedure were reviewed by me with the patient prior to the procedure   Current Facility-Administered Medications:    0.9 %  sodium chloride infusion, , Intravenous, Continuous, [name], DO, Last Rate: 20 mL/hr at 10/16/21 1020, New Bag at 10/16/21 1020      Allergies  Allergen Reactions   Oxycodone    Sulfa Antibiotics Nausea Only   Allergies were reviewed by me prior to the procedure  Objective    Vitals:   10/16/21 1010  BP: 113/69  Pulse: 68  Resp: 20  Temp: (!) 97.4 F (36.3 C)  TempSrc: Temporal  SpO2: 98%  Weight: 83.9 kg  Height: 5\' 6"  (1.676 m)     Physical Exam Vitals and nursing note reviewed.  Constitutional:      General: She is not in acute distress.    Appearance: Normal appearance. She is obese. She is not ill-appearing, toxic-appearing or diaphoretic.  HENT:     Head: Normocephalic and atraumatic.     Nose: Nose normal.     Mouth/Throat:     Mouth: Mucous membranes are moist.     Pharynx: Oropharynx is clear.  Eyes:     General: No scleral icterus.    Extraocular Movements: Extraocular movements intact.  Cardiovascular:     Rate and Rhythm: Normal rate and regular rhythm.     Heart sounds: Normal heart sounds. No murmur heard.   No friction rub. No gallop.  Pulmonary:     Effort: Pulmonary effort is normal. No respiratory distress.     Breath sounds: Normal breath sounds. No wheezing, rhonchi or rales.  Abdominal:     General: Bowel sounds are normal. There is no distension.     Palpations: Abdomen is soft.     Tenderness: There is no abdominal tenderness. There is no guarding or rebound.  Musculoskeletal:     Cervical back: Neck supple.     Right lower leg: No edema.     Left lower leg: No edema.  Skin:    General: Skin is warm and dry.     Coloration: Skin  is not jaundiced or pale.  Neurological:     General: No focal deficit present.     Mental Status: She is alert and oriented to person, place, and time. Mental status is at baseline.  Psychiatric:        Mood and Affect: Mood normal.        Behavior: Behavior normal.        Thought Content: Thought content normal.        Judgment: Judgment normal.  Assessment:  Ms. Marie Melendez is a 66 y.o. female  who presents today for Colonoscopy for Surveillance- phx colon polyps.  Plan:  Colonoscopy with possible intervention today  Colonoscopy with possible biopsy, control of bleeding, polypectomy, and interventions as necessary has been discussed with the patient/patient representative. Informed consent was obtained from the patient/patient representative after explaining the indication, nature, and risks of the procedure including but not limited to death, bleeding, perforation, missed neoplasm/lesions, cardiorespiratory compromise, and reaction to medications. Opportunity for questions was given and appropriate answers were provided. Patient/patient representative has verbalized understanding is amenable to undergoing the procedure.   [name], DO  [facility]  Portions of the record may have been created with voice recognition software. Occasional wrong-word or 'sound-a-like' substitutions may have occurred due to the inherent limitations of voice recognition software.  Read the chart carefully and recognize, using context, where substitutions may have occurred.

## 2021-10-16 ENCOUNTER — Ambulatory Visit: Payer: BC Managed Care – PPO | Admitting: Anesthesiology

## 2021-10-16 ENCOUNTER — Encounter
Admission: RE | Disposition: A | Discharge status: Home / Self Care | Payer: Self-pay | Source: Home / Self Care | Attending: Gastroenterology

## 2021-10-16 ENCOUNTER — Ambulatory Visit
Admission: RE | Admit: 2021-10-16 | Discharge: 2021-10-16 | Disposition: A | Discharge status: Home / Self Care | Payer: BC Managed Care – PPO | Attending: Gastroenterology | Admitting: Gastroenterology

## 2021-10-16 ENCOUNTER — Encounter: Payer: Self-pay | Admitting: Gastroenterology

## 2021-10-16 DIAGNOSIS — Z86010 Personal history of colonic polyps: Secondary | ICD-10-CM | POA: Diagnosis not present

## 2021-10-16 DIAGNOSIS — D122 Benign neoplasm of ascending colon: Secondary | ICD-10-CM | POA: Insufficient documentation

## 2021-10-16 DIAGNOSIS — K219 Gastro-esophageal reflux disease without esophagitis: Secondary | ICD-10-CM | POA: Diagnosis not present

## 2021-10-16 DIAGNOSIS — E785 Hyperlipidemia, unspecified: Secondary | ICD-10-CM | POA: Diagnosis not present

## 2021-10-16 DIAGNOSIS — F418 Other specified anxiety disorders: Secondary | ICD-10-CM | POA: Insufficient documentation

## 2021-10-16 DIAGNOSIS — G4730 Sleep apnea, unspecified: Secondary | ICD-10-CM | POA: Insufficient documentation

## 2021-10-16 DIAGNOSIS — K621 Rectal polyp: Secondary | ICD-10-CM | POA: Diagnosis not present

## 2021-10-16 DIAGNOSIS — E669 Obesity, unspecified: Secondary | ICD-10-CM | POA: Diagnosis not present

## 2021-10-16 DIAGNOSIS — D125 Benign neoplasm of sigmoid colon: Secondary | ICD-10-CM | POA: Insufficient documentation

## 2021-10-16 DIAGNOSIS — K5730 Diverticulosis of large intestine without perforation or abscess without bleeding: Secondary | ICD-10-CM | POA: Diagnosis not present

## 2021-10-16 DIAGNOSIS — Z90710 Acquired absence of both cervix and uterus: Secondary | ICD-10-CM | POA: Insufficient documentation

## 2021-10-16 DIAGNOSIS — Z6829 Body mass index (BMI) 29.0-29.9, adult: Secondary | ICD-10-CM | POA: Insufficient documentation

## 2021-10-16 DIAGNOSIS — Z1211 Encounter for screening for malignant neoplasm of colon: Secondary | ICD-10-CM | POA: Insufficient documentation

## 2021-10-16 DIAGNOSIS — D123 Benign neoplasm of transverse colon: Secondary | ICD-10-CM | POA: Insufficient documentation

## 2021-10-16 DIAGNOSIS — F17210 Nicotine dependence, cigarettes, uncomplicated: Secondary | ICD-10-CM | POA: Insufficient documentation

## 2021-10-16 HISTORY — PX: COLONOSCOPY WITH PROPOFOL: SHX5780

## 2021-10-16 HISTORY — DX: Sleep apnea, unspecified: G47.30

## 2021-10-16 SURGERY — COLONOSCOPY WITH PROPOFOL
Anesthesia: General

## 2021-10-16 MED ORDER — EPHEDRINE SULFATE (PRESSORS) 50 MG/ML IJ SOLN
INTRAMUSCULAR | Status: DC | PRN
  Administered 2021-10-16: 5 mg via INTRAVENOUS

## 2021-10-16 MED ORDER — EPHEDRINE 5 MG/ML INJ
INTRAVENOUS | Status: AC
  Filled 2021-10-16: qty 5

## 2021-10-16 MED ORDER — SODIUM CHLORIDE 0.9 % IV SOLN: INTRAVENOUS | Status: DC

## 2021-10-16 MED ORDER — PROPOFOL 500 MG/50ML IV EMUL
INTRAVENOUS | Status: AC
  Filled 2021-10-16: qty 50

## 2021-10-16 MED ORDER — PROPOFOL 500 MG/50ML IV EMUL
INTRAVENOUS | Status: DC | PRN
  Administered 2021-10-16: 160 ug/kg/min via INTRAVENOUS

## 2021-10-16 NOTE — Anesthesia Postprocedure Evaluation (Signed)
Anesthesia Post Note  Patient: Marie Melendez  Procedure(s) Performed: COLONOSCOPY WITH PROPOFOL  Anesthesia Type: General Level of consciousness: awake and awake and alert Pain management: satisfactory to patient Vital Signs Assessment: post-procedure vital signs reviewed and stable Respiratory status: spontaneous breathing and nonlabored ventilation Cardiovascular status: stable Anesthetic complications: no   No notable events documented.   Last Vitals:  Vitals:   10/16/21 1120 10/16/21 1150  BP: (!) 147/78 (!) 148/80  Pulse: (!) 58   Resp: 19   Temp:    SpO2: 97%     Last Pain:  Vitals:   10/16/21 1150  TempSrc:   PainSc: 0-No pain                 VAN STAVEREN,Jackilyn Umphlett

## 2021-10-16 NOTE — Anesthesia Procedure Notes (Signed)
Date/Time: 10/16/2021 10:37 AM Performed by: Vaughan Sine Pre-anesthesia Checklist: Patient identified, Emergency Drugs available, Suction available, Patient being monitored and Timeout performed Patient Re-evaluated:Patient Re-evaluated prior to induction Oxygen Delivery Method: Simple face mask Preoxygenation: Pre-oxygenation with 100% oxygen Induction Type: IV induction Placement Confirmation: positive ETCO2 and CO2 detector

## 2021-10-16 NOTE — Anesthesia Preprocedure Evaluation (Signed)
Anesthesia Evaluation  Patient identified by MRN, date of birth, ID band Patient awake    Reviewed: Allergy & Precautions, NPO status , Patient's Chart, lab work & pertinent test results  Airway Mallampati: II  TM Distance: >3 FB Neck ROM: full    Dental  (+) Teeth Intact   Pulmonary neg pulmonary ROS, sleep apnea and Continuous Positive Airway Pressure Ventilation , COPD, Current Smoker,    Pulmonary exam normal  + decreased breath sounds      Cardiovascular Exercise Tolerance: Good + Peripheral Vascular Disease  negative cardio ROS Normal cardiovascular exam Rhythm:Regular Rate:Normal     Neuro/Psych Anxiety Depression negative neurological ROS  negative psych ROS   GI/Hepatic negative GI ROS, Neg liver ROS, GERD  ,  Endo/Other  negative endocrine ROS  Renal/GU negative Renal ROS  negative genitourinary   Musculoskeletal negative musculoskeletal ROS (+)   Abdominal (+) + obese,   Peds negative pediatric ROS (+)  Hematology negative hematology ROS (+) Blood dyscrasia, anemia ,   Anesthesia Other Findings Past Medical History: No date: Anemia No date: Anxiety No date: Chronic kidney disease No date: Depression No date: Endometriosis No date: GERD (gastroesophageal reflux disease) No date: Hyperlipidemia No date: Nephrolithiasis No date: Peripheral vascular disease ([facility]) No date: Sleep apnea  Past Surgical History: No date: ABDOMINAL HYSTERECTOMY No date: COLON SURGERY 02/05/2016: COLONOSCOPY WITH PROPOFOL; N/A     Comment:  Procedure: COLONOSCOPY WITH PROPOFOL;  Surgeon: Hulen Luster, MD;  Location: [facility] ENDOSCOPY;  Service:               Gastroenterology;  Laterality: N/A; No date: EXCISION LESION FOOT No date: HERNIA REPAIR No date: TONSILLECTOMY  BMI    Body Mass Index: 29.86 kg/m      Reproductive/Obstetrics negative OB ROS                              Anesthesia Physical Anesthesia Plan  ASA: 3  Anesthesia Plan: General   Post-op Pain Management:    Induction: Intravenous  PONV Risk Score and Plan: Propofol infusion and TIVA  Airway Management Planned: Natural Airway and Nasal Cannula  Additional Equipment:   Intra-op Plan:   Post-operative Plan:   Informed Consent: I have reviewed the patients History and Physical, chart, labs and discussed the procedure including the risks, benefits and alternatives for the proposed anesthesia with the patient or authorized representative who has indicated his/her understanding and acceptance.     Dental Advisory Given  Plan Discussed with: CRNA and Surgeon  Anesthesia Plan Comments:         Anesthesia Quick Evaluation

## 2021-10-16 NOTE — Transfer of Care (Signed)
Immediate Anesthesia Transfer of Care Note  Patient: Marie Melendez  Procedure(s) Performed: COLONOSCOPY WITH PROPOFOL  Patient Location: PACU  Anesthesia Type:General  Level of Consciousness: awake and sedated  Airway & Oxygen Therapy: Patient Spontanous Breathing and Patient connected to nasal cannula oxygen  Post-op Assessment: Report given to RN and Post -op Vital signs reviewed and stable  Post vital signs: Reviewed and stable  Last Vitals:  Vitals Value Taken Time  BP    Temp    Pulse    Resp    SpO2      Last Pain:  Vitals:   10/16/21 1010  TempSrc: Temporal  PainSc: 0-No pain         Complications: No notable events documented.

## 2021-10-16 NOTE — Interval H&P Note (Signed)
History and Physical Interval Note: Preprocedure H&P from 10/16/21  was reviewed and there was no interval change after seeing and examining the patient.  Written consent was obtained from the patient after discussion of risks, benefits, and alternatives. Patient has consented to proceed with Colonoscopy with possible intervention   10/16/2021 10:25 AM  Marie Melendez  has presented today for surgery, with the diagnosis of personal history colon polyps.  The various methods of treatment have been discussed with the patient and family. After consideration of risks, benefits and other options for treatment, the patient has consented to  Procedure(s): COLONOSCOPY WITH PROPOFOL (N/A) as a surgical intervention.  The patient's history has been reviewed, patient examined, no change in status, stable for surgery.  I have reviewed the patient's chart and labs.  Questions were answered to the patient's satisfaction.     [name]

## 2021-10-16 NOTE — Op Note (Signed)
[facility] Gastroenterology Patient Name: Marie Melendez Procedure Date: 10/16/2021 10:10 AM MRN: [id number] Account #: [id number] Date of Birth: Aug 31, 1956 Admit Type: Outpatient Age: 66 Room: [facility] ENDO ROOM 1 Gender: Female Note Status: Finalized Instrument Name: Colonoscope [id number] Procedure:             Colonoscopy Indications:           High risk colon cancer surveillance: Personal history                         of colonic polyps Providers:             Rueben Bash, DO Referring MD:          Gayland Curry MD, MD (Referring MD) Medicines:             Monitored Anesthesia Care Complications:         No immediate complications. Estimated blood loss:                         Minimal. Procedure:             Pre-Anesthesia Assessment:                        - Prior to the procedure, a History and Physical was                         performed, and patient medications and allergies were                         reviewed. The patient is competent. The risks and                         benefits of the procedure and the sedation options and                         risks were discussed with the patient. All questions                         were answered and informed consent was obtained.                         Patient identification and proposed procedure were                         verified by the physician, the nurse, the anesthetist                         and the technician in the endoscopy suite. Mental                         Status Examination: alert and oriented. Airway                         Examination: normal oropharyngeal airway and neck                         mobility. Respiratory Examination: clear to  auscultation. CV Examination: RRR, no murmurs, no S3                         or S4. Prophylactic Antibiotics: The patient does not                         require prophylactic antibiotics. Prior                          Anticoagulants: The patient has taken no previous                         anticoagulant or antiplatelet agents. ASA Grade                         Assessment: III - A patient with severe systemic                         disease. After reviewing the risks and benefits, the                         patient was deemed in satisfactory condition to                         undergo the procedure. The anesthesia plan was to use                         monitored anesthesia care (MAC). Immediately prior to                         administration of medications, the patient was                         re-assessed for adequacy to receive sedatives. The                         heart rate, respiratory rate, oxygen saturations,                         blood pressure, adequacy of pulmonary ventilation, and                         response to care were monitored throughout the                         procedure. The physical status of the patient was                         re-assessed after the procedure.                        After obtaining informed consent, the colonoscope was                         passed under direct vision. Throughout the procedure,                         the patient's blood pressure, pulse, and oxygen  saturations were monitored continuously. The                         Colonoscope was introduced through the anus and                         advanced to the the terminal ileum, with                         identification of the appendiceal orifice and IC                         valve. The colonoscopy was performed without                         difficulty. The patient tolerated the procedure well.                         The quality of the bowel preparation was evaluated                         using the BBPS [facility] Bowel Preparation Scale) with                         scores of: Right Colon = 3, Transverse Colon = 3 and                         Left Colon = 3  (entire mucosa seen well with no                         residual staining, small fragments of stool or opaque                         liquid). The total BBPS score equals 9. The terminal                         ileum, ileocecal valve, appendiceal orifice, and                         rectum were photographed. Findings:      The perianal and digital rectal examinations were normal. Pertinent       negatives include normal sphincter tone.      The terminal ileum appeared normal. Estimated blood loss: none.      A few small-mouthed diverticula were found in the sigmoid colon.       Estimated blood loss: none.      Five sessile polyps were found in the rectum(3), transverse colon (1)       and ascending colon (1). The polyps were 1 to 2 mm in size. These polyps       were removed with a cold biopsy forceps. Resection and retrieval were       complete. Estimated blood loss was minimal.      Four sessile and semi-pedunculated polyps were found in the rectum (1),       sigmoid colon (1) and transverse colon (2). The polyps were 4 to 7 mm in       size. These polyps were removed with a cold snare. Resection and  retrieval were complete. Estimated blood loss was minimal. To prevent       bleeding after the polypectomy, one hemostatic clip was successfully       placed (MR conditional). There was no bleeding at the end of the       procedure. Estimated blood loss was minimal.      The exam was otherwise without abnormality on direct and retroflexion       views. Impression:            - The examined portion of the ileum was normal.                        - Diverticulosis in the sigmoid colon.                        - Five 1 to 2 mm polyps in the rectum, in the                         transverse colon and in the ascending colon, removed                         with a cold biopsy forceps. Resected and retrieved.                        - Four 4 to 7 mm polyps in the rectum, in the sigmoid                          colon and in the transverse colon, removed with a cold                         snare. Resected and retrieved. Clip (MR conditional)                         was placed.                        - The examination was otherwise normal on direct and                         retroflexion views. Recommendation:        - Discharge patient to home.                        - Resume previous diet.                        - No aspirin, ibuprofen, naproxen, or other                         non-steroidal anti-inflammatory drugs for 5 days after                         polyp removal.                        - Continue present medications.                        - Await pathology results.                        -  Repeat colonoscopy for surveillance based on                         pathology results.                        - Return to referring physician as previously                         scheduled.                        - The findings and recommendations were discussed with                         the patient. Procedure Code(s):     --- Professional ---                        [phone], Colonoscopy, flexible; with removal of                         tumor(s), polyp(s), or other lesion(s) by snare                         technique                        45380, 34, Colonoscopy, flexible; with biopsy, single                         or multiple Diagnosis Code(s):     --- Professional ---                        Z86.010, Personal history of colonic polyps                        K62.1, Rectal polyp                        K63.5, Polyp of colon                        K57.30, Diverticulosis of large intestine without                         perforation or abscess without bleeding CPT copyright 2019 [employer] Association. All rights reserved. The codes documented in this report are preliminary and upon coder review may  be revised to meet current compliance requirements. Attending  Participation:      I personally performed the entire procedure. Volney American, DO [name] DO, DO 10/16/2021 11:14:32 AM This report has been signed electronically. Number of Addenda: 0 Note Initiated On: 10/16/2021 10:10 AM Scope Withdrawal Time: 0 hours 29 minutes 59 seconds  Total Procedure Duration: 0 hours 34 minutes 30 seconds  Estimated Blood Loss:  Estimated blood loss was minimal.      [facility]

## 2021-10-19 ENCOUNTER — Encounter: Payer: Self-pay | Admitting: Gastroenterology

## 2021-10-20 LAB — SURGICAL PATHOLOGY

## 2022-05-16 IMAGING — MG MM DIGITAL SCREENING BILAT W/ TOMO AND CAD
4 series · 4 of 8 positions shown · non-contrast
Comparison: Previous exam(s).

CLINICAL DATA: Screening.

EXAM:
DIGITAL SCREENING BILATERAL MAMMOGRAM WITH TOMOSYNTHESIS AND CAD
TECHNIQUE: Bilateral screening digital craniocaudal and mediolateral oblique
mammograms were obtained. Bilateral screening digital breast
tomosynthesis was performed. The images were evaluated with
computer-aided detection.

[L MLO synth-2D]
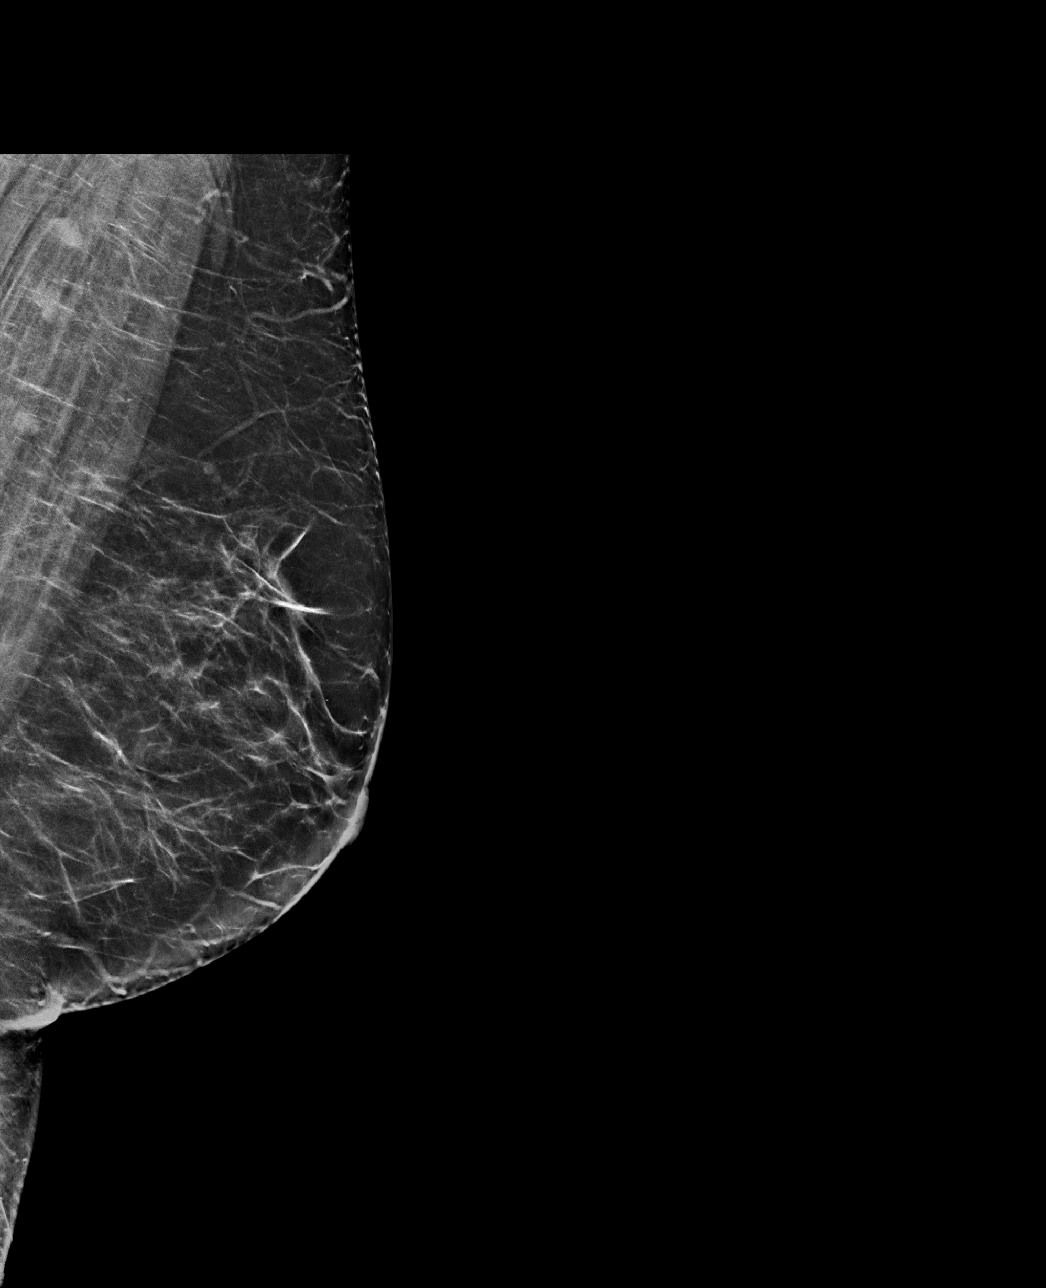

[R CC synth-2D]
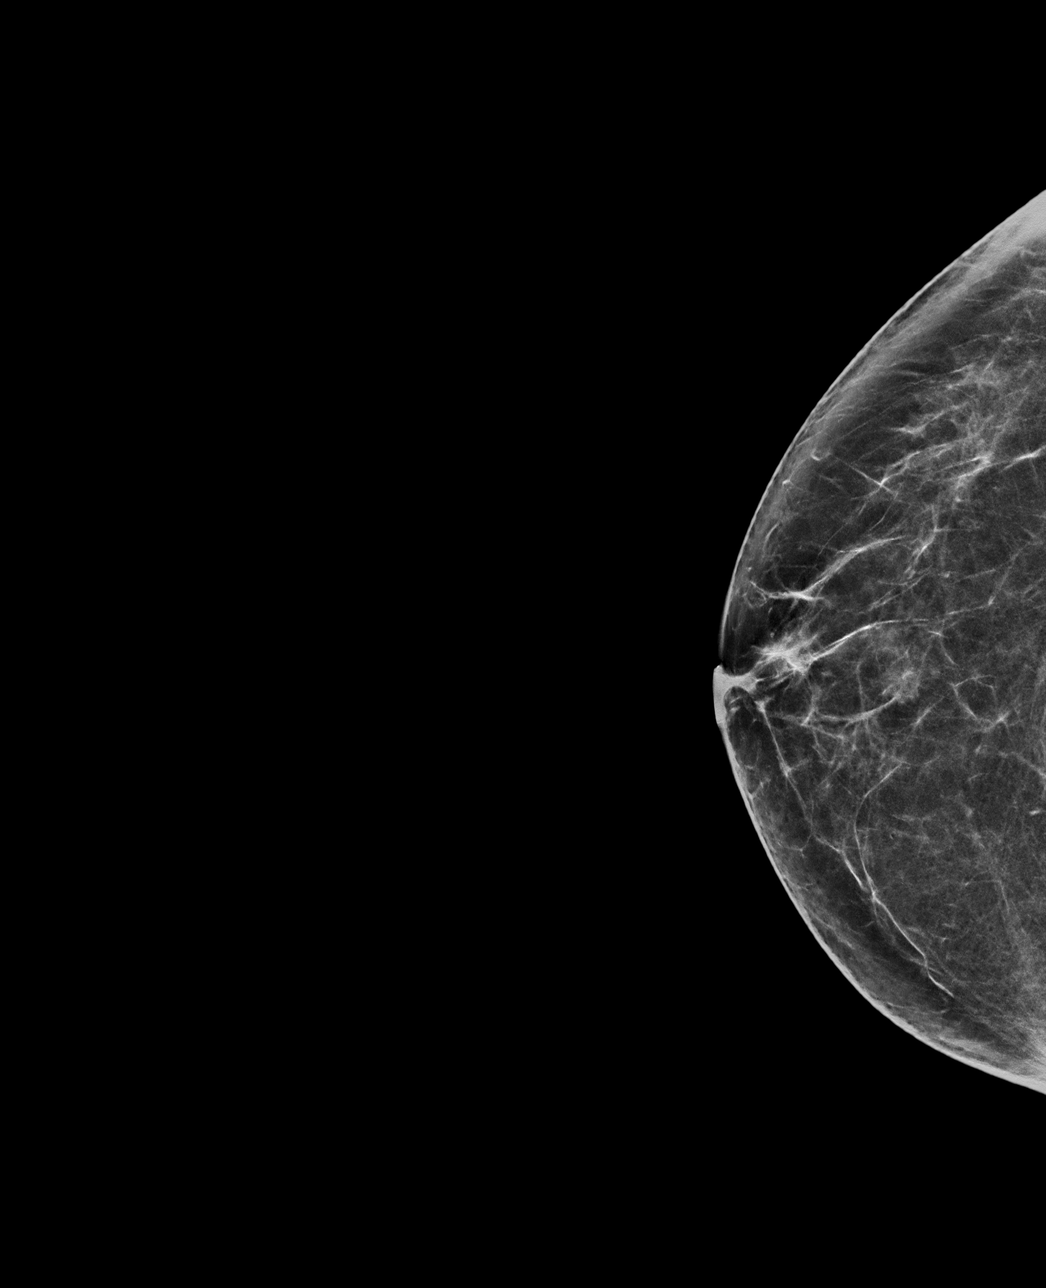

[L CC synth-2D]
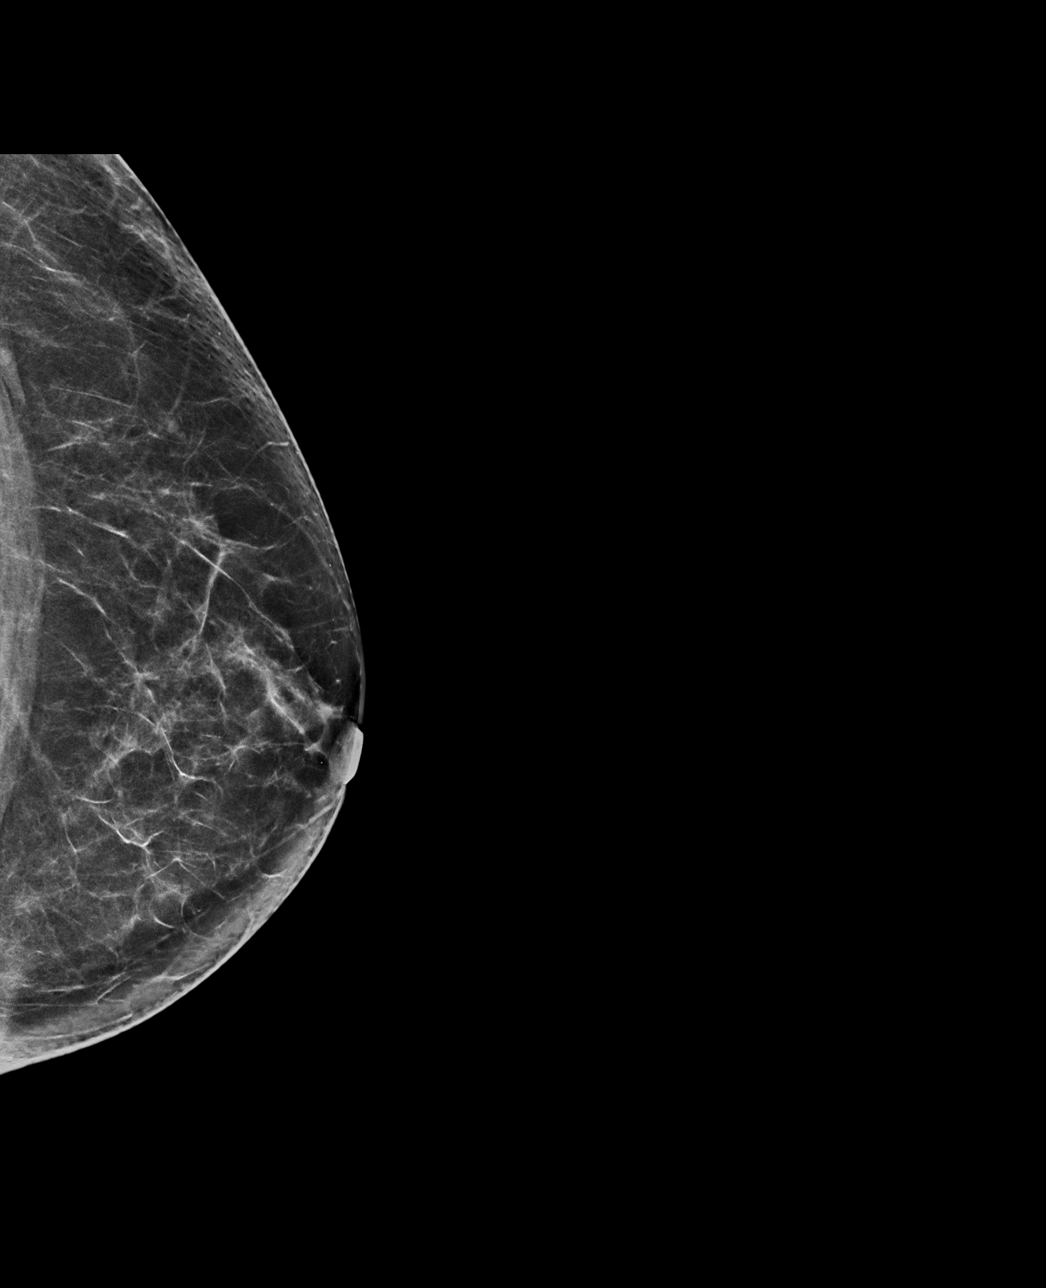

[L CC tomo · tomo slice 37/72.0]
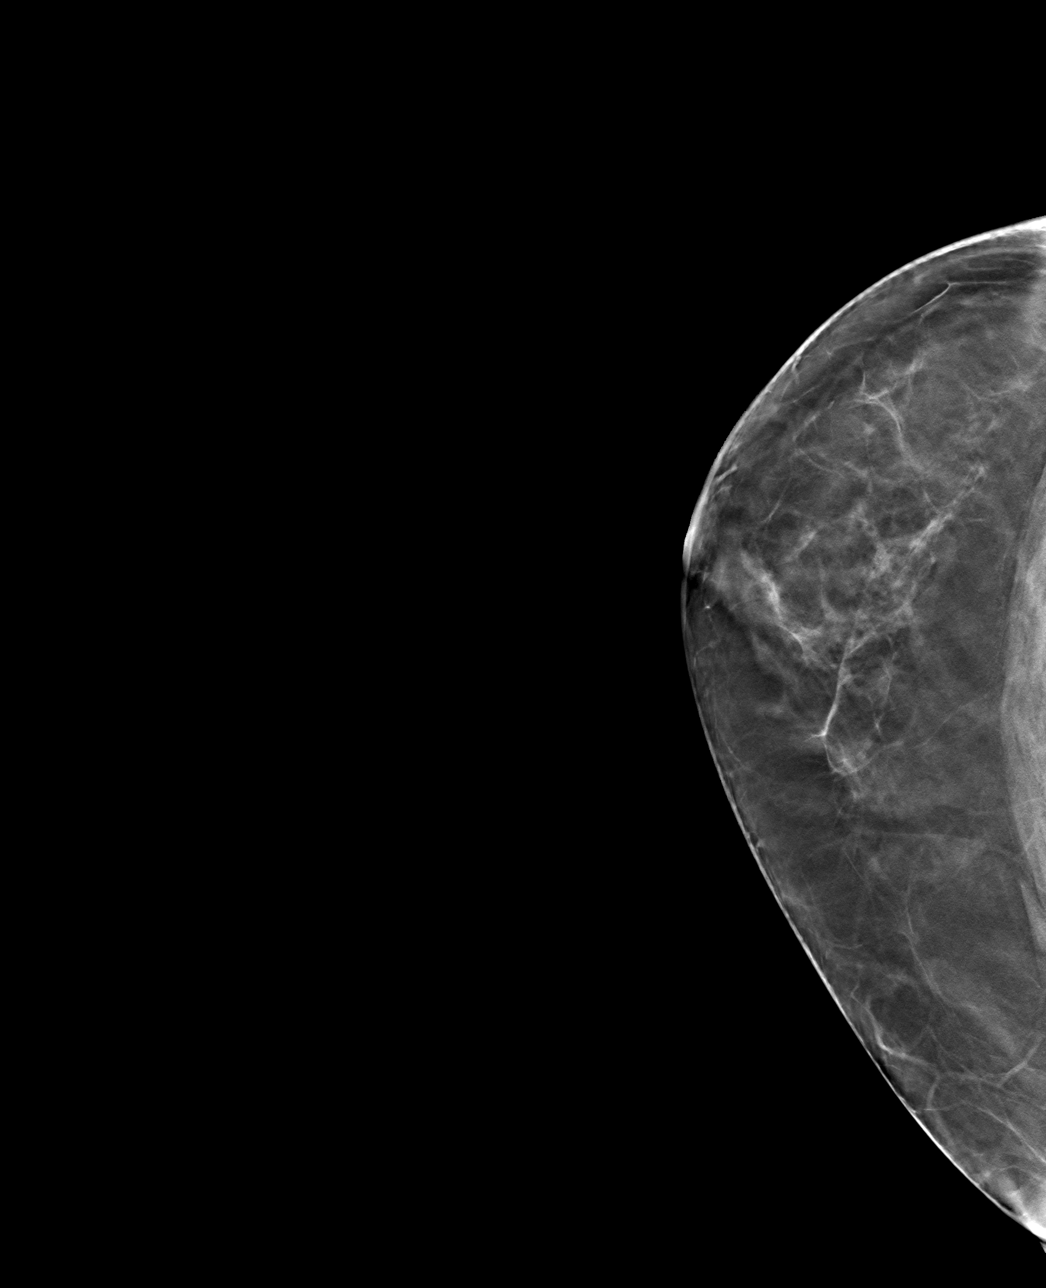

[4 of 8 positions shown; findings below may reference images not displayed]

ACR Breast Density Category b: There are scattered areas of
fibroglandular density.
FINDINGS: There are no findings suspicious for malignancy. The images were
evaluated with computer-aided detection.
IMPRESSION: No mammographic evidence of malignancy. A result letter of this
screening mammogram will be mailed directly to the patient.

RECOMMENDATION:
Screening mammogram in one year. (Code:WJ-I-BG6)

BI-RADS CATEGORY  1: Negative.

## 2022-11-12 ENCOUNTER — Ambulatory Visit
Admission: EM | Admit: 2022-11-12 | Discharge: 2022-11-12 | Disposition: A | Discharge status: Home / Self Care | Payer: BC Managed Care – PPO | Attending: Emergency Medicine | Admitting: Emergency Medicine

## 2022-11-12 DIAGNOSIS — R42 Dizziness and giddiness: Secondary | ICD-10-CM

## 2022-11-12 MED ORDER — DIAZEPAM 2 MG PO TABS: 2.0000 mg | ORAL_TABLET | Freq: Four times a day (QID) | ORAL | 0 refills | Status: AC | PRN

## 2022-11-12 MED ORDER — MECLIZINE HCL 25 MG PO TABS: 25.0000 mg | ORAL_TABLET | Freq: Three times a day (TID) | ORAL | 0 refills | Status: AC | PRN

## 2022-11-12 NOTE — Discharge Instructions (Signed)
Today you have been evaluated for dizziness, on exam there are no neurological abnormalities which would indicate a more serious cause nor there are abnormalities to your ear that would indicate infection or sinus congestion as a cause therefore we will move forward with treatment of your symptoms  You may use Valium every 6 hours as needed to help minimize her dizziness, be mindful of this will make you drowsy, use sparingly as you will only be dispensed 12 tablets  You may use meclizine 3 times daily as needed (every 8 hours), be mindful this medication may make you drowsy, take first dosage at home, if helpful but not resolving symptoms you may attempt use of 50 mg (2 tablets)  Continue to change positions slowly, waiting at least 10 seconds between movements to help the body stabilize   Ensure adequate intake of water as dehydration will make your symptoms worse  May Attempt to complete the following exercises below to see if they will help minimize your dizziness  -Shake: Turn your head from right to left and back again 10 times in 10 seconds. Twist your head round as far as it will go comfortably when you do this, and look in the direction your head is pointing. Wait 10 seconds after you have done 10 complete turns, then do 10 more.  -Nod: Nod your head up and down and back again 10 times in 10 seconds. Tip your head as far as it will go comfortably when you do this, and look in the direction your head is pointing. Wait 10 seconds after you have done 10 complete turns, then do 10 more.  -Shake, eyes closed (EC): Carry out the shake exercise with your eyes closed. Wait 10 seconds after you have done 10 complete turns, then do 10 more.  -Nod, eyes closed (EC): Carry out the nod exercise with your eyes closed. Wait 10 seconds after you have done 10 complete turns, then do 10 more.  -Shake/stare: Hold your finger pointing upwards in front of you and carry out the shake exercise while staring at  your finger. Do not let your eyes move from your finger. Wait 10 seconds after you have done 10 complete turns, then do 10 more.  -Nod/stare: Hold your finger pointing sideways in front of you and carry out the nod exercise while staring at your finger. Do not let your eyes move from your finger. Wait 10 seconds after you have done 10 complete turns, then do 10 more.  Schedule follow-up appointment with your primary doctor in 1 week

## 2022-11-12 NOTE — ED Triage Notes (Signed)
Pt c/o vertigo x1day  Pt is having difficulty walking due to dizziness.   Pt states that she has been seeing "squiggly signs" earlier in the week and is currently not seeing them. Pt is diabetic  Pt was driven here by her sister.

## 2022-11-12 NOTE — ED Provider Notes (Signed)
[facility]    CSN: [id number] Arrival date & time: 11/12/22  1214      History   Chief Complaint Chief Complaint  Patient presents with   Dizziness    HPI Marie Melendez is a 67 y.o. female.   For evaluation of dizziness beginning 1 day ago.  Dizziness is described as feeling imbalance making it difficult for her to walk.  Before symptoms began she is visualizing clear squiggly lines and was experiencing blurred vision which is now resolved.  Has been having intermittent headaches described as pain in various locations.  Has not attempted treatment of symptoms.  History of vertigo, has taken Valium with success in the past per patient.  Denies lightheadedness, syncope, memory or speech changes.  Denies ear pain, drainage, pruritus.  Past Medical History:  Diagnosis Date   Anemia    Anxiety    Chronic kidney disease    Depression    Endometriosis    GERD (gastroesophageal reflux disease)    Hyperlipidemia    Nephrolithiasis    Peripheral vascular disease ([place])    Sleep apnea     Patient Active Problem List   Diagnosis Date Noted   Anxiety 01/15/2020   GERD (gastroesophageal reflux disease) 01/15/2020   History of DVT (deep vein thrombosis) 01/15/2020   Varicose veins of leg with pain, left 01/15/2020   Bilateral foot pain 02/21/2019   Gout screen 02/21/2019   History of alcohol abuse 01/22/2019   Mass of hand, right 04/06/2018   Lower extremity pain 02/23/2018   Multiple pulmonary nodules 03/10/2017   Hyperlipidemia 02/04/2017   Swelling of limb 02/04/2017   Venous (peripheral) insufficiency 02/04/2017   Leukocytosis 12/22/2016   OSA on CPAP 12/19/2015   Cold sore 12/18/2014   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   Cigarette smoker 12/12/2013    Past Surgical History:  Procedure Laterality Date   ABDOMINAL HYSTERECTOMY     COLON SURGERY     COLONOSCOPY WITH PROPOFOL N/A 02/05/2016   Procedure: COLONOSCOPY WITH PROPOFOL;  Surgeon: Hulen Luster, MD;   Location: [facility] ENDOSCOPY;  Service: Gastroenterology;  Laterality: N/A;   COLONOSCOPY WITH PROPOFOL N/A 10/16/2021   Procedure: COLONOSCOPY WITH PROPOFOL;  Surgeon: [name], DO;  Location[facility] ENDOSCOPY;  Service: Gastroenterology;  Laterality: N/A;   EXCISION LESION FOOT     HERNIA REPAIR     TONSILLECTOMY      OB History   No obstetric history on file.      Home Medications    Prior to Admission medications   Medication Sig Start Date End Date Taking? Authorizing Provider  ascorbic acid (VITAMIN C) 1000 MG tablet Take 1,000 mg by mouth daily.   Yes [provider]  atorvastatin (LIPITOR) 40 MG tablet Take 40 mg by mouth daily. 07/26/19  Yes [provider]  cetirizine HCl (ZYRTEC) 1 MG/ML solution Take by mouth.   Yes [provider]  Cholecalciferol 1000 units capsule Take 1,000 Units by mouth daily.   Yes [provider]  cyanocobalamin 1000 MCG tablet Take 1,000 mcg by mouth daily.   Yes [provider]  Lysine 500 MG TABS Take by mouth.   Yes [provider]  omega-3 acid ethyl esters (LOVAZA) 1 g capsule Take 1 g by mouth daily.   Yes [provider]  omeprazole (PRILOSEC) 20 MG capsule Take 20 mg by mouth daily as needed.   Yes [provider]  PARoxetine (PAXIL) 20 MG tablet Take 20 mg  by mouth daily.   Yes [provider]  Turmeric (QC TUMERIC COMPLEX PO) Take by mouth.   Yes [provider]  Karen Kays TO [place] extract   Yes [provider]  Vitamin B Complex-C CAPS Take by mouth.   Yes [provider]  albuterol (PROVENTIL HFA;VENTOLIN HFA) 108 (90 Base) MCG/ACT inhaler Inhale 1-2 puffs into the lungs every 6 (six) hours as needed for wheezing or shortness of breath. Patient not taking: Reported on 01/15/2020 01/09/18   Norval Gable, MD  aspirin 81 MG EC tablet Take 81 mg by mouth daily. Swallow whole. Patient not taking: Reported on 10/16/2021     [provider]  benzonatate (TESSALON) 200 MG capsule Take 1 capsule (200 mg total) by mouth 3 (three) times daily as needed. Patient not taking: Reported on 01/15/2020 08/08/19   Norval Gable, MD  doxycycline (VIBRA-TABS) 100 MG tablet Take 1 tablet (100 mg total) by mouth 2 (two) times daily. Patient not taking: Reported on 03/01/2018 01/09/18   Norval Gable, MD  lovastatin (MEVACOR) 40 MG tablet TAKE 2 TABLETS BY MOUTH ONCE DAILY WITH DINNER 02/17/18   [provider]  nicotine (NICODERM CQ - DOSED IN MG/24 HOURS) 21 mg/24hr patch Place 21 mg onto the skin daily. Patient not taking: Reported on 10/16/2021    [provider]  predniSONE (DELTASONE) 10 MG tablet Start 60 mg po day one, then 50 mg po day two, taper by 10 mg daily until complete. Patient not taking: Reported on 01/15/2020 08/08/19   Norval Gable, MD    Family History Family History  Problem Relation Age of Onset   Breast cancer Mother 53   Thyroid cancer Mother    Lung cancer Father    Breast cancer Paternal Aunt    Bone cancer Maternal Grandmother    Breast cancer Maternal Grandmother    Lymphoma Maternal Grandmother    Colon cancer Paternal Grandfather     Social History Social History   Tobacco Use   Smoking status: Former    Packs/day: 1.00    Years: 39.00    Total pack years: 39.00    Types: Cigarettes   Smokeless tobacco: Never  Vaping Use   Vaping Use: Never used  Substance Use Topics   Alcohol use: Not Currently    Comment: quit Jan 2020   Drug use: Yes    Frequency: 28.0 times per week    Types: Marijuana    Comment: 4 times per day     Allergies   Oxycodone and Sulfa antibiotics   Review of Systems Review of Systems  Constitutional: Negative.   HENT: Negative.    Eyes: Negative.   Respiratory: Negative.    Skin: Negative.   Neurological:  Positive for dizziness. Negative for tremors, seizures, syncope, facial asymmetry, speech difficulty, weakness,  light-headedness, numbness and headaches.     Physical Exam Triage Vital Signs ED Triage Vitals  Enc Vitals Group     BP 11/12/22 1318 137/82     Pulse Rate 11/12/22 1318 63     Resp --      Temp 11/12/22 1318 98.1 F (36.7 C)     Temp Source 11/12/22 1318 Oral     SpO2 11/12/22 1318 97 %     Weight 11/12/22 1315 175 lb (79.4 kg)     Height 11/12/22 1315 '5\' 6"'$  (1.676 m)     Head Circumference --      Peak Flow --  Pain Score 11/12/22 1315 0     Pain Loc --      Pain Edu? --      Excl. in [place]? --    No data found.  Updated Vital Signs BP 137/82 (BP Location: Left Arm)   Pulse 63   Temp 98.1 F (36.7 C) (Oral)   Ht '5\' 6"'$  (1.676 m)   Wt 175 lb (79.4 kg)   SpO2 97%   BMI 28.25 kg/m   Visual Acuity Right Eye Distance:   Left Eye Distance:   Bilateral Distance:    Right Eye Near:   Left Eye Near:    Bilateral Near:     Physical Exam Constitutional:      Appearance: Normal appearance.  HENT:     Right Ear: Tympanic membrane, ear canal and external ear normal.     Left Ear: Tympanic membrane, ear canal and external ear normal.  Eyes:     Extraocular Movements: Extraocular movements intact.  Pulmonary:     Effort: Pulmonary effort is normal.  Neurological:     General: No focal deficit present.     Mental Status: She is alert and oriented to person, place, and time. Mental status is at baseline.     Cranial Nerves: No cranial nerve deficit.     Sensory: No sensory deficit.     Motor: No weakness.     Coordination: Coordination abnormal.     Gait: Gait normal.      UC Treatments / Results  Labs (all labs ordered are listed, but only abnormal results are displayed) Labs Reviewed - No data to display  EKG   Radiology No results found.  Procedures Procedures (including critical care time)  Medications Ordered in UC Medications - No data to display  Initial Impression / Assessment and Plan / UC Course  I have reviewed the triage vital signs  and the nursing notes.  Pertinent labs & imaging results that were available during my care of the patient were reviewed by me and considered in my medical decision making (see chart for details).  Dizziness  Vitals are stable patient is in no signs of distress no neurological abnormalities present on exam, no abnormalities to the ear, stable at this time, evaluated meclizine prescribed and discussed supportive measures such as increase fluid intake and adequate rest as well as sitting while symptoms are present to prevent fall and injury, may follow-up with her primary doctor if symptoms continue to persist or worsen Final Clinical Impressions(s) / UC Diagnoses   Final diagnoses:  None   Discharge Instructions   None    ED Prescriptions   None    PDMP not reviewed this encounter.   Hans Eden, NP 11/12/22 1434

## 2023-06-03 ENCOUNTER — Ambulatory Visit
Admission: EM | Admit: 2023-06-03 | Discharge: 2023-06-03 | Disposition: A | Discharge status: Home / Self Care | Payer: BC Managed Care – PPO | Attending: Family Medicine | Admitting: Family Medicine

## 2023-06-03 DIAGNOSIS — R21 Rash and other nonspecific skin eruption: Secondary | ICD-10-CM

## 2023-06-03 MED ORDER — PREDNISONE 10 MG PO TABS: ORAL_TABLET | ORAL | 0 refills | Status: AC

## 2023-06-03 MED ORDER — CLOBETASOL PROPIONATE 0.05 % EX OINT: 1.0000 | TOPICAL_OINTMENT | Freq: Two times a day (BID) | CUTANEOUS | 1 refills | Status: AC

## 2023-06-03 NOTE — ED Provider Notes (Signed)
[facility]    CSN: [id number] Arrival date & time: 06/03/23  1226      History   Chief Complaint Chief Complaint  Patient presents with  . Rash    HPI ROSILYN Melendez is a 67 y.o. female.   HPI  [name] presents for ***    ***Redness, pain, swelling ***Treatments tried ***Previous symptoms ***Insect or bug bites  Fever : no Vision changes: No Sore throat: no   Shortness of breath: no Rhinorrhea: no Appetite: normal  Hydration: normal  Abdominal pain: no Nausea: no Vomiting: no Diarrhea: no Dysuria: no  Sleep disturbance: no Arthralgias: no Headache: no   Past Medical History:  Diagnosis Date  . Anemia   . Anxiety   . Chronic kidney disease   . Depression   . Endometriosis   . GERD (gastroesophageal reflux disease)   . Hyperlipidemia   . Nephrolithiasis   . Peripheral vascular disease (HCC)   . Sleep apnea     Patient Active Problem List   Diagnosis Date Noted  . Anxiety 01/15/2020  . GERD (gastroesophageal reflux disease) 01/15/2020  . History of DVT (deep vein thrombosis) 01/15/2020  . Varicose veins of leg with pain, left 01/15/2020  . Bilateral foot pain 02/21/2019  . Gout screen 02/21/2019  . History of alcohol abuse 01/22/2019  . Mass of hand, right 04/06/2018  . Lower extremity pain 02/23/2018  . Multiple pulmonary nodules 03/10/2017  . Hyperlipidemia 02/04/2017  . Swelling of limb 02/04/2017  . Venous (peripheral) insufficiency 02/04/2017  . Leukocytosis 12/22/2016  . OSA on CPAP 12/19/2015  . Cold sore 12/18/2014  . 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 Cigarette smoker 12/12/2013    Past Surgical History:  Procedure Laterality Date  . ABDOMINAL HYSTERECTOMY    . COLON SURGERY    . COLONOSCOPY WITH PROPOFOL N/A 02/05/2016   Procedure: COLONOSCOPY WITH PROPOFOL;  Surgeon: Wallace Cullens, MD;  Location: [facility] -  ENDOSCOPY;  Service: Gastroenterology;  Laterality: N/A;  . COLONOSCOPY WITH PROPOFOL N/A 10/16/2021   Procedure:  COLONOSCOPY WITH PROPOFOL;  Surgeon: Jaynie Collins, DO;  Location: [facility] ENDOSCOPY;  Service: Gastroenterology;  Laterality: N/A;  . EXCISION LESION FOOT    . HERNIA REPAIR    . TONSILLECTOMY      OB History   No obstetric history on file.      Home Medications    Prior to Admission medications   Medication Sig Start Date End Date Taking? Authorizing Provider  ascorbic acid (VITAMIN C) 1000 MG tablet Take 1,000 mg by mouth daily.   Yes [provider]  atorvastatin (LIPITOR) 40 MG tablet Take 40 mg by mouth daily. 07/26/19  Yes [provider]  cetirizine HCl (ZYRTEC) 1 MG/ML solution Take by mouth.   Yes [provider]  Cholecalciferol 1000 units capsule Take 1,000 Units by mouth daily.   Yes [provider]  clobetasol ointment (TEMOVATE) 0.05 % Apply 1 Application topically 2 (two) times daily. For very severe eczema.  Do not use for more than 1 week at a time. 06/03/23  Yes Peggie Hornak, DO  Lysine 500 MG TABS Take by mouth.   Yes [provider]  omeprazole (PRILOSEC) 20 MG capsule Take 20 mg by mouth daily as needed.   Yes [provider]  PARoxetine (PAXIL) 20 MG tablet Take 20 mg by mouth daily.   Yes [provider]  Turmeric (QC TUMERIC COMPLEX PO) Take by mouth.   Yes [provider]  Ardelia Mems  TO FIND Cherry extract   Yes [provider]  Vitamin B Complex-C CAPS Take by mouth.   Yes [provider]  aspirin 81 MG EC tablet Take 81 mg by mouth daily. Swallow whole. Patient not taking: Reported on 10/16/2021    [provider]  cyanocobalamin 1000 MCG tablet Take 1,000 mcg by mouth daily.    [provider]  diazepam (VALIUM) 2 MG tablet Take 1 tablet (2 mg total) by mouth every 6 (six) hours as needed for anxiety. 11/12/22   White, Elita Boone, NP  lovastatin (MEVACOR) 40 MG tablet TAKE 2 TABLETS BY MOUTH ONCE DAILY WITH DINNER 02/17/18   [provider]   meclizine (ANTIVERT) 25 MG tablet Take 1 tablet (25 mg total) by mouth 3 (three) times daily as needed for dizziness. 11/12/22   White, Elita Boone, NP  nicotine (NICODERM CQ - DOSED IN MG/24 HOURS) 21 mg/24hr patch Place 21 mg onto the skin daily. Patient not taking: Reported on 10/16/2021    [provider]  omega-3 acid ethyl esters (LOVAZA) 1 g capsule Take 1 g by mouth daily.    [provider]  predniSONE (DELTASONE) 10 MG tablet Start 60 mg po day one, then 50 mg po day two, taper by 10 mg daily until complete. 06/03/23   Katha Cabal, DO    Family History Family History  Problem Relation Age of Onset  . Breast cancer Mother 80  . Thyroid cancer Mother   . Lung cancer Father   . Breast cancer Paternal Aunt   . Bone cancer Maternal Grandmother   . Breast cancer Maternal Grandmother   . Lymphoma Maternal Grandmother   . Colon cancer Paternal Grandfather     Social History Social History   Tobacco Use  . Smoking status: Former    Current packs/day: 1.00    Average packs/day: 1 pack/day for 39.0 years (39.0 ttl pk-yrs)    Types: Cigarettes  . Smokeless tobacco: Never  Vaping Use  . Vaping status: Never Used  Substance Use Topics  . Alcohol use: Not Currently    Comment: quit Jan 2020  . Drug use: Yes    Frequency: 28.0 times per week    Types: Marijuana    Comment: 4 times per day     Allergies   Oxycodone and Sulfa antibiotics   Review of Systems Review of Systems :negative unless otherwise stated in HPI.      Physical Exam Triage Vital Signs ED Triage Vitals  Encounter Vitals Group     BP 06/03/23 1352 132/85     Systolic BP Percentile --      Diastolic BP Percentile --      Pulse Rate 06/03/23 1352 75     Resp --      Temp 06/03/23 1352 98.6 F (37 C)     Temp Source 06/03/23 1352 Oral     SpO2 06/03/23 1352 93 %     Weight 06/03/23 1348 180 lb (81.6 kg)     Height 06/03/23 1348 5\' 7"  (1.702 m)     Head Circumference --       Peak Flow --      Pain Score 06/03/23 1347 0     Pain Loc --      Pain Education --      Exclude from Growth Chart --    No data found.  Updated Vital Signs BP 132/85 (BP Location: Left Arm)   Pulse 75  Temp 98.6 F (37 C) (Oral)   Ht 5\' 7"  (1.702 m)   Wt 81.6 kg   SpO2 93%   BMI 28.19 kg/m   Visual Acuity Right Eye Distance:   Left Eye Distance:   Bilateral Distance:    Right Eye Near:   Left Eye Near:    Bilateral Near:     Physical Exam  GEN: alert, well appearing female, in no acute distress *** EYES: extra occular movements intact, no scleral injection*** CV: regular rate ***and rhythm RESP: no increased work of breathing, ***clear to ascultation bilaterally MSK: no extremity edema, no gross deformities NEURO: alert, moves all extremities appropriately, normal gait PSYCH: Normal affect, appropriate speech and behavior  SKIN: warm and dry; ***   ***pic  UC Treatments / Results  Labs (all labs ordered are listed, but only abnormal results are displayed) Labs Reviewed - No data to display  EKG   Radiology No results found.  Procedures Procedures (including critical care time)  Medications Ordered in UC Medications - No data to display  Initial Impression / Assessment and Plan / UC Course  I have reviewed the triage vital signs and the nursing notes.  Pertinent labs & imaging results that were available during my care of the patient were reviewed by me and considered in my medical decision making (see chart for details).     Patient is a 67 y.o. femalewho presents for***.  Overall, patient is well-appearing and well-hydrated.  Vital signs stable.  Jeanne is afebrile.  Exam concerning for ***.  Treat with***steroid ointment. ***No sign of infection to suggest antibiotics at this time.     Reviewed expectations regarding course of current medical issues.  All questions asked were answered.  Outlined signs and symptoms indicating need for more  acute intervention. Patient verbalized understanding. After Visit Summary given.   Final Clinical Impressions(s) / UC Diagnoses   Final diagnoses:  Rash     Discharge Instructions      Stop by the pharmacy to pick up your prescriptions.  Follow up with your primary care provider as needed.    ED Prescriptions     Medication Sig Dispense Auth. Provider   predniSONE (DELTASONE) 10 MG tablet Start 60 mg po day one, then 50 mg po day two, taper by 10 mg daily until complete. 21 tablet Roylene Heaton, DO   clobetasol ointment (TEMOVATE) 0.05 % Apply 1 Application topically 2 (two) times daily. For very severe eczema.  Do not use for more than 1 week at a time. 60 g Katha Cabal, DO      PDMP not reviewed this encounter.

## 2023-06-03 NOTE — Discharge Instructions (Signed)
Stop by the pharmacy to pick up your prescriptions.  Follow up with your primary care provider as needed.

## 2023-06-03 NOTE — ED Triage Notes (Signed)
Pt c/o rash along right ankle x1day   Pt was at her mothers house picking tomatos and started to form blisters along the back of her right ankle.   Pt states that her ankle is swollen and the blisters are clear with fluid in them. Pt states that she was scratching and popped some of the blisters.   Pt mother states that her garden is full of fire ants and believe that she got bit.   Pt has used OTC clobetasol for the itching.

## 2023-08-03 ENCOUNTER — Encounter: Payer: Self-pay | Admitting: Family Medicine

## 2023-08-03 DIAGNOSIS — Z1231 Encounter for screening mammogram for malignant neoplasm of breast: Secondary | ICD-10-CM

## 2023-08-04 ENCOUNTER — Other Ambulatory Visit: Payer: Self-pay | Admitting: Family Medicine

## 2023-08-04 DIAGNOSIS — Z1231 Encounter for screening mammogram for malignant neoplasm of breast: Secondary | ICD-10-CM

## 2023-08-31 ENCOUNTER — Ambulatory Visit
Admission: RE | Admit: 2023-08-31 | Discharge: 2023-08-31 | Disposition: A | Discharge status: Home / Self Care | Payer: Medicare PPO | Source: Ambulatory Visit | Attending: Family Medicine | Admitting: Family Medicine

## 2023-08-31 DIAGNOSIS — Z1231 Encounter for screening mammogram for malignant neoplasm of breast: Secondary | ICD-10-CM | POA: Insufficient documentation

## 2023-09-05 ENCOUNTER — Other Ambulatory Visit: Payer: Self-pay | Admitting: Family Medicine

## 2023-09-05 DIAGNOSIS — R928 Other abnormal and inconclusive findings on diagnostic imaging of breast: Secondary | ICD-10-CM

## 2023-09-12 ENCOUNTER — Ambulatory Visit
Admission: RE | Admit: 2023-09-12 | Discharge: 2023-09-12 | Disposition: A | Discharge status: Home / Self Care | Payer: Medicare PPO | Source: Ambulatory Visit | Attending: Family Medicine | Admitting: Family Medicine

## 2023-09-12 DIAGNOSIS — R928 Other abnormal and inconclusive findings on diagnostic imaging of breast: Secondary | ICD-10-CM

## 2024-07-19 ENCOUNTER — Other Ambulatory Visit: Payer: Self-pay | Admitting: Pulmonary Disease

## 2024-07-19 DIAGNOSIS — R0600 Dyspnea, unspecified: Secondary | ICD-10-CM

## 2024-07-19 DIAGNOSIS — R911 Solitary pulmonary nodule: Secondary | ICD-10-CM

## 2024-07-30 ENCOUNTER — Ambulatory Visit
Admission: RE | Admit: 2024-07-30 | Discharge: 2024-07-30 | Disposition: A | Discharge status: Home / Self Care | Source: Ambulatory Visit | Attending: Pulmonary Disease | Admitting: Pulmonary Disease

## 2024-07-30 DIAGNOSIS — R911 Solitary pulmonary nodule: Secondary | ICD-10-CM | POA: Insufficient documentation

## 2024-07-30 DIAGNOSIS — R0600 Dyspnea, unspecified: Secondary | ICD-10-CM | POA: Insufficient documentation

## 2024-07-30 DIAGNOSIS — R0689 Other abnormalities of breathing: Secondary | ICD-10-CM | POA: Insufficient documentation

## 2024-08-20 ENCOUNTER — Ambulatory Visit: Admission: EM | Admit: 2024-08-20 | Discharge: 2024-08-20 | Disposition: A | Discharge status: Home / Self Care

## 2024-08-20 ENCOUNTER — Ambulatory Visit

## 2024-08-20 ENCOUNTER — Ambulatory Visit (HOSPITAL_COMMUNITY): Payer: Self-pay

## 2024-08-20 DIAGNOSIS — M533 Sacrococcygeal disorders, not elsewhere classified: Secondary | ICD-10-CM

## 2024-08-20 DIAGNOSIS — S300XXA Contusion of lower back and pelvis, initial encounter: Secondary | ICD-10-CM | POA: Diagnosis not present

## 2024-08-20 MED ORDER — IBUPROFEN 800 MG PO TABS
800.0000 mg | ORAL_TABLET | Freq: Once | ORAL | Status: AC
  Administered 2024-08-20: 800 mg via ORAL

## 2024-08-20 NOTE — ED Triage Notes (Signed)
 Patient states that she fell down her steps yesterday on her butt and injured the top of her butt

## 2024-08-20 NOTE — Discharge Instructions (Addendum)
 Your x-rays did not show any evidence of broken or dislocated bones in your coccyx.  I do believe that you have bruised her tailbone as a result of your recent fall.  You may apply ice to the area for 20 minutes at a time, 2-3 times a day, to help with pain and inflammation.  You may continue to use over-the-counter Tylenol arthritis and supplement with over-the-counter ibuprofen .  Follow the package instructions for dosing.  Purchasing an inflatable or foam doughnut cushion can also be helpful in aiding to pain relief as you can sit on it and take pressure off her tailbone as it heals.  Please return for reevaluation for any new or worsening symptoms.

## 2024-08-20 NOTE — ED Provider Notes (Addendum)
 [facility]    CSN: [id number] Arrival date & time: 08/20/24  9095      History   Chief Complaint Chief Complaint  Patient presents with   Fall    HPI Marie Melendez is a 68 y.o. female.   HPI  68 year old female with past medical history significant for hyperlipidemia, PVD, GERD, DVT, OSA on CPAP presents for evaluation of tailbone pain after sliding down the steps yesterday.  She has been using Tylenol arthritis but is unable to determine if it is helping her pain.  Past Medical History:  Diagnosis Date   Anemia    Anxiety    Chronic kidney disease    Depression    Endometriosis    GERD (gastroesophageal reflux disease)    Hyperlipidemia    Nephrolithiasis    Peripheral vascular disease    Sleep apnea     Patient Active Problem List   Diagnosis Date Noted   Anxiety 01/15/2020   GERD (gastroesophageal reflux disease) 01/15/2020   History of DVT (deep vein thrombosis) 01/15/2020   Varicose veins of leg with pain, left 01/15/2020   Bilateral foot pain 02/21/2019   Gout screen 02/21/2019   History of alcohol abuse 01/22/2019   Mass of hand, right 04/06/2018   Lower extremity pain 02/23/2018   Multiple pulmonary nodules 03/10/2017   Hyperlipidemia 02/04/2017   Swelling of limb 02/04/2017   Venous (peripheral) insufficiency 02/04/2017   Leukocytosis 12/22/2016   OSA on CPAP 12/19/2015   Cold sore 12/18/2014   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   Cigarette smoker 12/12/2013    Past Surgical History:  Procedure Laterality Date   ABDOMINAL HYSTERECTOMY     COLON SURGERY     COLONOSCOPY WITH PROPOFOL  N/A 02/05/2016   Procedure: COLONOSCOPY WITH PROPOFOL ;  Surgeon: Deward CINDERELLA Piedmont, MD;  Location: [facility] ENDOSCOPY;  Service: Gastroenterology;  Laterality: N/A;   COLONOSCOPY WITH PROPOFOL  N/A 10/16/2021   Procedure: COLONOSCOPY WITH PROPOFOL ;  Surgeon: Onita Elspeth Sharper, DO;  Location: [facility] ENDOSCOPY;  Service: Gastroenterology;  Laterality: N/A;    EXCISION LESION FOOT     HERNIA REPAIR     TONSILLECTOMY      OB History   No obstetric history on file.      Home Medications    Prior to Admission medications   Medication Sig Start Date End Date Taking? Authorizing Provider  rosuvastatin (CRESTOR) 40 MG tablet Take 40 mg by mouth. 06/04/24 06/04/25 Yes [provider]  ascorbic acid (VITAMIN C) 1000 MG tablet Take 1,000 mg by mouth daily.    [provider]  aspirin 81 MG EC tablet Take 81 mg by mouth daily. Swallow whole. Patient not taking: Reported on 10/16/2021    [provider]  atorvastatin (LIPITOR) 40 MG tablet Take 40 mg by mouth daily. 07/26/19   [provider]  cetirizine HCl (ZYRTEC) 1 MG/ML solution Take by mouth.    [provider]  Cholecalciferol 1000 units capsule Take 1,000 Units by mouth daily.    [provider]  clobetasol  ointment (TEMOVATE ) 0.05 % Apply 1 Application topically 2 (two) times daily. For very severe eczema.  Do not use for more than 1 week at a time. 06/03/23   Brimage, Vondra, DO  cyanocobalamin 1000 MCG tablet Take 1,000 mcg by mouth daily.    [provider]  diazepam  (VALIUM ) 2 MG tablet Take 1 tablet (2 mg total) by mouth every 6 (six) hours as needed for anxiety. 11/12/22   Teresa Price  R, NP  lovastatin (MEVACOR) 40 MG tablet TAKE 2 TABLETS BY MOUTH ONCE DAILY WITH DINNER 02/17/18   [provider]  Lysine 500 MG TABS Take by mouth.    [provider]  meclizine  (ANTIVERT ) 25 MG tablet Take 1 tablet (25 mg total) by mouth 3 (three) times daily as needed for dizziness. 11/12/22   White, Shelba SAUNDERS, NP  nicotine (NICODERM CQ - DOSED IN MG/24 HOURS) 21 mg/24hr patch Place 21 mg onto the skin daily. Patient not taking: Reported on 10/16/2021    [provider]  omega-3 acid ethyl esters (LOVAZA) 1 g capsule Take 1 g by mouth daily.    [provider]  omeprazole (PRILOSEC) 20 MG capsule Take 20 mg by  mouth daily as needed.    [provider]  PARoxetine (PAXIL) 20 MG tablet Take 20 mg by mouth daily.    [provider]  predniSONE  (DELTASONE ) 10 MG tablet Start 60 mg po day one, then 50 mg po day two, taper by 10 mg daily until complete. 06/03/23   Brimage, Vondra, DO  Turmeric (QC TUMERIC COMPLEX PO) Take by mouth.    [provider]  CARMEL TO FIND Cherry extract    [provider]  Vitamin B Complex-C CAPS Take by mouth.    [provider]    Family History Family History  Problem Relation Age of Onset   Breast cancer Mother 38   Thyroid cancer Mother    Lung cancer Father    Breast cancer Paternal Aunt    Bone cancer Maternal Grandmother    Breast cancer Maternal Grandmother    Lymphoma Maternal Grandmother    Colon cancer Paternal Grandfather     Social History Social History   Tobacco Use   Smoking status: Former    Current packs/day: 1.00    Average packs/day: 1 pack/day for 39.0 years (39.0 ttl pk-yrs)    Types: Cigarettes   Smokeless tobacco: Never  Vaping Use   Vaping status: Never Used  Substance Use Topics   Alcohol use: Not Currently    Comment: quit Jan 2020   Drug use: Yes    Frequency: 28.0 times per week    Types: Marijuana    Comment: 4 times per day     Allergies   Oxycodone and Sulfa antibiotics   Review of Systems Review of Systems  Musculoskeletal:        Pain in the tailbone  Skin:  Negative for color change.     Physical Exam Triage Vital Signs ED Triage Vitals  Encounter Vitals Group     BP      Girls Systolic BP Percentile      Girls Diastolic BP Percentile      Boys Systolic BP Percentile      Boys Diastolic BP Percentile      Pulse      Resp      Temp      Temp src      SpO2      Weight      Height      Head Circumference      Peak Flow      Pain Score      Pain Loc      Pain Education      Exclude from Growth Chart    No data found.  Updated Vital Signs BP (!)  148/72 (BP Location: Left Arm)   Pulse 70   Temp 99  F (37.2 C) (Oral)   Resp 18   Wt 175 lb (79.4 kg)   SpO2 95%   BMI 27.41 kg/m   Visual Acuity Right Eye Distance:   Left Eye Distance:   Bilateral Distance:    Right Eye Near:   Left Eye Near:    Bilateral Near:     Physical Exam Vitals and nursing note reviewed. Exam conducted with a chaperone present Gwynn Kitty, CMA.).  Constitutional:      Appearance: Normal appearance. She is not ill-appearing.  HENT:     Head: Normocephalic and atraumatic.  Musculoskeletal:        General: Tenderness and signs of injury present. No swelling.  Skin:    General: Skin is warm and dry.     Capillary Refill: Capillary refill takes less than 2 seconds.     Findings: No bruising or erythema.  Neurological:     General: No focal deficit present.     Mental Status: She is alert and oriented to person, place, and time.      UC Treatments / Results  Labs (all labs ordered are listed, but only abnormal results are displayed) Labs Reviewed - No data to display  EKG   Radiology No results found.  Procedures Procedures (including critical care time)  Medications Ordered in UC Medications  ibuprofen  (ADVIL ) tablet 800 mg (800 mg Oral Given 08/20/24 0954)    Initial Impression / Assessment and Plan / UC Course  I have reviewed the triage vital signs and the nursing notes.  Pertinent labs & imaging results that were available during my care of the patient were reviewed by me and considered in my medical decision making (see chart for details).   Patient is a nontoxic-appearing 68 year old female presenting for evaluation of pain in her tailbone after sliding down the steps yesterday.  She reports that she is mostly just sore and it is exacerbated by how she sits. Annabella Kitty, CMA, as chaperone I examined the patient's posterior pelvis and did not visualize any evidence of edema, ecchymosis, or erythema.  She does have mild  tenderness with palpation of her sacrum and coccyx.  No crepitus identified.  She has been taking Tylenol arthritis and it is indeterminate as whether or not it is helping her pain.  Upon review of records in epic the patient had a CMP performed in July with normal renal function so therefore I will give a single dose of 800 mg ibuprofen  here in clinic and I will obtain a radiograph of the patient's sacrum and coccyx to evaluate for any bony injury.  Sacrum and coccyx x-rays independently reviewed and evaluated by me.  Impression: No evidence of fracture or dislocation.  Soft tissues are unremarkable.  Radiology read is pending. Radiology impression states no acute bony abnormality of the sacrum coccyx.  I will discharge patient home with diagnosis of contusion of her sacrum and coccyx.  She can to continue to use over-the-counter Tylenol and/or ibuprofen  according to the package instructions as needed for pain.  She may also apply ice to the area for 20 minutes at a time, 2-3 times a day, to help with pain and inflammation.  A doughnut cushion may help her pain in the short-term as will take pressure off of her tailbone as it heals.  Return precautions reviewed.   Final Clinical Impressions(s) / UC Diagnoses   Final diagnoses:  Pain in the coccyx  Contusion of coccyx, initial encounter     Discharge Instructions  Your x-rays did not show any evidence of broken or dislocated bones in your coccyx.  I do believe that you have bruised her tailbone as a result of your recent fall.  You may apply ice to the area for 20 minutes at a time, 2-3 times a day, to help with pain and inflammation.  You may continue to use over-the-counter Tylenol arthritis and supplement with over-the-counter ibuprofen .  Follow the package instructions for dosing.  Purchasing an inflatable or foam doughnut cushion can also be helpful in aiding to pain relief as you can sit on it and take pressure off her tailbone as  it heals.  Please return for reevaluation for any new or worsening symptoms.     ED Prescriptions   None    PDMP not reviewed this encounter.   Bernardino Ditch, NP 08/20/24 1019    Bernardino Ditch, NP 08/20/24 1151
